# Patient Record
Sex: Female | Born: 1955
Health system: Southern US, Community
[De-identification: ages and names within clinical notes are randomized; demographics above are authoritative.]

## PROBLEM LIST (undated history)

## (undated) DIAGNOSIS — F32A Depression, unspecified: Secondary | ICD-10-CM

## (undated) DIAGNOSIS — F419 Anxiety disorder, unspecified: Secondary | ICD-10-CM

## (undated) DIAGNOSIS — E039 Hypothyroidism, unspecified: Secondary | ICD-10-CM

## (undated) DIAGNOSIS — J45909 Unspecified asthma, uncomplicated: Secondary | ICD-10-CM

## (undated) DIAGNOSIS — I10 Essential (primary) hypertension: Secondary | ICD-10-CM

## (undated) DIAGNOSIS — F329 Major depressive disorder, single episode, unspecified: Secondary | ICD-10-CM

## (undated) HISTORY — PX: TUBAL LIGATION: SHX77

## (undated) HISTORY — PX: APPENDECTOMY: SHX54

---

## 2000-02-10 ENCOUNTER — Ambulatory Visit (HOSPITAL_COMMUNITY): Admission: RE | Admit: 2000-02-10 | Discharge: 2000-02-10 | Payer: Self-pay | Admitting: *Deleted

## 2004-06-17 ENCOUNTER — Ambulatory Visit (HOSPITAL_COMMUNITY): Admission: RE | Admit: 2004-06-17 | Discharge: 2004-06-17 | Payer: Self-pay | Admitting: Internal Medicine

## 2010-01-18 ENCOUNTER — Ambulatory Visit (HOSPITAL_COMMUNITY): Admission: RE | Admit: 2010-01-18 | Discharge: 2010-01-18 | Payer: Self-pay | Admitting: Internal Medicine

## 2010-04-05 ENCOUNTER — Ambulatory Visit: Payer: Self-pay | Admitting: Gastroenterology

## 2010-04-05 DIAGNOSIS — R1031 Right lower quadrant pain: Secondary | ICD-10-CM | POA: Insufficient documentation

## 2010-04-05 DIAGNOSIS — K219 Gastro-esophageal reflux disease without esophagitis: Secondary | ICD-10-CM | POA: Insufficient documentation

## 2010-04-05 DIAGNOSIS — R198 Other specified symptoms and signs involving the digestive system and abdomen: Secondary | ICD-10-CM | POA: Insufficient documentation

## 2010-04-05 DIAGNOSIS — R1011 Right upper quadrant pain: Secondary | ICD-10-CM | POA: Insufficient documentation

## 2010-04-05 DIAGNOSIS — R634 Abnormal weight loss: Secondary | ICD-10-CM | POA: Insufficient documentation

## 2010-04-05 DIAGNOSIS — R112 Nausea with vomiting, unspecified: Secondary | ICD-10-CM | POA: Insufficient documentation

## 2010-04-06 ENCOUNTER — Ambulatory Visit (HOSPITAL_COMMUNITY): Admission: RE | Admit: 2010-04-06 | Discharge: 2010-04-06 | Payer: Self-pay | Admitting: Gastroenterology

## 2010-04-06 ENCOUNTER — Ambulatory Visit: Payer: Self-pay | Admitting: Gastroenterology

## 2010-06-04 ENCOUNTER — Encounter (INDEPENDENT_AMBULATORY_CARE_PROVIDER_SITE_OTHER): Payer: Self-pay | Admitting: *Deleted

## 2010-07-11 ENCOUNTER — Encounter: Payer: Self-pay | Admitting: Internal Medicine

## 2010-07-20 NOTE — Assessment & Plan Note (Signed)
Summary: ABD PAIN,GERD/ACID REFLUX/LAW   Visit Type:  Consult Referring Provider:  Sherwood Gambler Primary Care Provider:  Sherwood Gambler  Chief Complaint:  abd pain x several months.  History of Present Illness: [name] is a pleasant 55 y/o WF, patient of Dr. Sherwood Gambler, who presents with several month h/o right sided abd pain.  Abd pain for couple months duration. Pain is constant and in right abd. Feels like twisting sensation and swelling. Worse with most foods. Unable to eat much. Eating applesauce, crackers, soup. Cannot eat meat. Cannot sit, lay, walk when pain severe. Nausea, some vomiting. BMs used to be once per day. Now 2-3 loose stools per day. No melena, brbpr. Wakes up at night with abd pain. No nocturnal BMs. Taking ibuprofen 800mg , about 4 per day for abd pain. On ibuprofen chronically for headaches. No heartburn on Nexium. No GU issues. Lost 15 pounds.  Abd U/S 01/18/10-->gb normal, fatty liver. Labs 7/11-->CMET, CBC both normal. H.Pylori IgG negative. Amylase/lipase normal. TSH 5.527. Seen in ED at [facility] 03/17/10. CMET, CBC, lipase all normal. U/A negative. CT A/P without contrast-->s/p appendectomy, previous hypodense mass in lower uterine segment not identified but there is some residual scarring/fullness at this level which cannot be assesssed without IV contrast.  She reports gyn exam and vaginal u/s done and unremarkable.  Current Medications (verified): 1)  Synthroid 75 Mcg Tabs (Levothyroxine Sodium) .... Once Daily 2)  Triazolam 0.25 Mg Tabs (Triazolam) .... Once Daily 3)  Furosemide 20 Mg Tabs (Furosemide) .... Once Daily 4)  Clonazepam 1 Mg Tabs (Clonazepam) .... Once Daily 5)  Nexium 40 Mg Cpdr (Esomeprazole Magnesium) .... Once Daily 6)  Ambien 10 Mg Tabs (Zolpidem Tartrate) .... At Bedtime As Needed  Allergies (verified): 1)  ! Codeine 2)  ! Zoloft 3)  ! Celexa 4)  ! Paxil 5)  ! Lunesta (Eszopiclone)  Past History:  Past Medical History: Anxiety  Disorder GERD Hypothyroidism Migraines No EGD/TCS.  Past Surgical History: Appendectomy, 15 years ago Tubal Ligation Ovarian cystectomy  Family History: No FH of CRC, IBD, liver disease.  Social History: Married. One child. Nonsmoker. No drugs. Rare beer. Disability for anxiety.  Review of Systems General:  Complains of weight loss; denies fever, chills, sweats, anorexia, fatigue, and weakness. Eyes:  Denies vision loss. ENT:  Denies nasal congestion, sore throat, hoarseness, and difficulty swallowing. CV:  Denies chest pains, angina, palpitations, dyspnea on exertion, and peripheral edema. Resp:  Denies dyspnea at rest, dyspnea with exercise, cough, sputum, and wheezing. GI:  See HPI. GU:  Denies urinary burning, blood in urine, and abnormal vaginal bleeding. MS:  Denies joint pain / LOM. Derm:  Denies rash and itching. Neuro:  Denies weakness, frequent headaches, memory loss, and confusion. Psych:  Complains of anxiety; denies depression, memory loss, suicidal ideation, and hallucinations. Endo:  Complains of unusual weight change. Heme:  Denies bruising and bleeding. Allergy:  Denies hives and rash.  Vital Signs:  Patient profile:   55 year old female Height:      63 inches Weight:      158 pounds BMI:     28.09 Temp:     98.2 degrees F oral Pulse rate:   68 / minute BP sitting:   118 / 84  (left arm)  Vitals Entered By: Hendricks Limes LPN (April 05, 2010 8:25 AM)  Physical Exam  General:  Well developed, well nourished, no acute distress. Head:  Normocephalic and atraumatic. Eyes:  Conjunctivae pink, no scleral icterus.  Mouth:  Oropharyngeal  mucosa moist, pink.  No lesions, erythema or exudate.    Neck:  Supple; no masses or thyromegaly. Lungs:  Clear throughout to auscultation. Heart:  Regular rate and rhythm; no murmurs, rubs,  or bruits. Abdomen:  Soft. Positive BS. Diffuse tenderness in right abd but more in RLQ. No rebound or guarding. No HSM or masses.  No abd bruit or hernia.  Rectal:  deferred until time of colonoscopy.   Extremities:  No clubbing, cyanosis, edema or deformities noted. Neurologic:  Alert and  oriented x4;  grossly normal neurologically. Skin:  Intact without significant lesions or rashes. Cervical Nodes:  No significant cervical adenopathy. Psych:  Alert and cooperative. Normal mood and affect.  Impression & Recommendations:  Problem # 1:  ABDOMINAL PAIN, RIGHT LOWER QUADRANT (ICD-789.03)  Abd pain of two months duration, most in RLQ. Associated with N/V, diarrhea, weight loss. Noncontrast CT A/P does not explain symptoms. Abd u/s unremarkable as well. Labs and U/A unremarkable. s/p prior appendectomy. DDx includes biliary etiolgoy, functional abd pain/IBS, IBD, PUD, unlikely malignancy. Recommend EGD/TCS as initial step in w/u. If negative, consider HIDA. Colonoscopy/EGD to be performed in near future.  Risks, alternatives, and benefits including but not limited to the risk of reaction to medication, bleeding, infection, and perforation were addressed.  Patient voiced understanding and provided verbal consent.   Orders: Consultation Level IV (16109)  Problem # 2:  WEIGHT LOSS (ICD-783.21)  Secondary to limited by mouth intake related to abd pain.  Orders: Consultation Level IV (60454)  Problem # 3:  GERD (ICD-530.81)  Chronic GERD, well-controlled on Nexium.  Orders: Consultation Level IV (09811)  Problem # 4:  CHANGE IN BOWELS (ICD-787.99)  Change in bowels, from one normal stool daily to diarrhea. Colonoscopy as planned.  CONSIDER SB BX AT TIME OF ENDO.  Orders: Consultation Level IV (91478) Prescriptions: PROMETHAZINE HCL 25 MG TABS (PROMETHAZINE HCL) one by mouth every 4-6 hours as needed n/v  #20 x 0   Entered and Authorized by:   Leanna Battles. Dixon Boos   Signed by:   Leanna Battles Trisa Cranor PA-C on 04/05/2010   Method used:   Print then Give to Patient   RxID:   [id number] HYDROCODONE-ACETAMINOPHEN  5-500 MG TABS (HYDROCODONE-ACETAMINOPHEN) one by mouth every 4-6 hours as needed pain  #20 x 0   Entered and Authorized by:   Leanna Battles. Dixon Boos   Signed by:   Leanna Battles Riggin Cuttino PA-C on 04/05/2010   Method used:   Print then Give to Patient   RxID:   [id number] PROMETHAZINE HCL 25 MG/ML SOLN (PROMETHAZINE HCL) one by mouth every 4-6 hours as needed pain  #20 x 0   Entered and Authorized by:   Leanna Battles. Dixon Boos   Signed by:   Leanna Battles Jannel Lynne PA-C on 04/05/2010   Method used:   Print then Give to Patient   RxID:   [id number]  DISREGARD THE PROMETHAZINE SOLUTION RX. IT WAS VOIDED.  I would like to thank Dr. Sherwood Gambler for allowing [place] to take part in the care of this nice patient.

## 2010-07-20 NOTE — Letter (Signed)
Summary: TCS/EGD ORDER  TCS/EGD ORDER   Imported By: Ave Filter 04/05/2010 09:24:05  _____________________________________________________________________  External Attachment:    Type:   Image     Comment:   External Document

## 2010-07-22 NOTE — Letter (Signed)
Summary: Recall Office Visit  [facility]  [address]   [place], [place] [address]   Phone: [phone]  Fax: [phone]      June 04, 2010   [facility] [address] [place], [place]  [address] [birth year]   Dear Rebekah Gonzalez,   According to our records, it is time for you to schedule a follow-up office visit with [place].   At your convenience, please call [phone] to schedule an office visit. If you have any questions, concerns, or feel that this letter is in error, we would appreciate your call.   Sincerely,    Rexene Alberts  [facility] Ph: [phone]   Fax: [phone]

## 2011-11-29 DIAGNOSIS — R51 Headache: Secondary | ICD-10-CM | POA: Diagnosis not present

## 2011-11-29 DIAGNOSIS — I499 Cardiac arrhythmia, unspecified: Secondary | ICD-10-CM | POA: Diagnosis not present

## 2011-11-29 DIAGNOSIS — Z802 Family history of malignant neoplasm of other respiratory and intrathoracic organs: Secondary | ICD-10-CM | POA: Diagnosis not present

## 2011-11-29 DIAGNOSIS — G459 Transient cerebral ischemic attack, unspecified: Secondary | ICD-10-CM | POA: Diagnosis not present

## 2011-11-29 DIAGNOSIS — R4789 Other speech disturbances: Secondary | ICD-10-CM | POA: Diagnosis not present

## 2011-11-29 DIAGNOSIS — R112 Nausea with vomiting, unspecified: Secondary | ICD-10-CM | POA: Diagnosis not present

## 2011-11-29 DIAGNOSIS — R471 Dysarthria and anarthria: Secondary | ICD-10-CM | POA: Diagnosis not present

## 2011-11-29 DIAGNOSIS — R42 Dizziness and giddiness: Secondary | ICD-10-CM | POA: Diagnosis not present

## 2011-11-29 DIAGNOSIS — Z8249 Family history of ischemic heart disease and other diseases of the circulatory system: Secondary | ICD-10-CM | POA: Diagnosis not present

## 2011-11-29 DIAGNOSIS — Z2821 Immunization not carried out because of patient refusal: Secondary | ICD-10-CM | POA: Diagnosis not present

## 2011-11-29 DIAGNOSIS — G319 Degenerative disease of nervous system, unspecified: Secondary | ICD-10-CM | POA: Diagnosis not present

## 2011-11-29 DIAGNOSIS — R404 Transient alteration of awareness: Secondary | ICD-10-CM | POA: Diagnosis not present

## 2011-11-29 DIAGNOSIS — Z823 Family history of stroke: Secondary | ICD-10-CM | POA: Diagnosis not present

## 2011-11-29 DIAGNOSIS — R29898 Other symptoms and signs involving the musculoskeletal system: Secondary | ICD-10-CM | POA: Diagnosis not present

## 2011-11-29 DIAGNOSIS — R5381 Other malaise: Secondary | ICD-10-CM | POA: Diagnosis not present

## 2011-11-29 DIAGNOSIS — K219 Gastro-esophageal reflux disease without esophagitis: Secondary | ICD-10-CM | POA: Diagnosis not present

## 2011-11-29 DIAGNOSIS — F411 Generalized anxiety disorder: Secondary | ICD-10-CM | POA: Diagnosis not present

## 2011-11-29 DIAGNOSIS — R0602 Shortness of breath: Secondary | ICD-10-CM | POA: Diagnosis not present

## 2011-11-30 DIAGNOSIS — G459 Transient cerebral ischemic attack, unspecified: Secondary | ICD-10-CM | POA: Diagnosis not present

## 2011-11-30 DIAGNOSIS — R4789 Other speech disturbances: Secondary | ICD-10-CM | POA: Diagnosis not present

## 2011-11-30 DIAGNOSIS — R42 Dizziness and giddiness: Secondary | ICD-10-CM | POA: Diagnosis not present

## 2011-11-30 DIAGNOSIS — I6350 Cerebral infarction due to unspecified occlusion or stenosis of unspecified cerebral artery: Secondary | ICD-10-CM | POA: Diagnosis not present

## 2011-12-01 DIAGNOSIS — G459 Transient cerebral ischemic attack, unspecified: Secondary | ICD-10-CM | POA: Diagnosis not present

## 2011-12-20 DIAGNOSIS — F329 Major depressive disorder, single episode, unspecified: Secondary | ICD-10-CM | POA: Diagnosis not present

## 2011-12-20 DIAGNOSIS — G4700 Insomnia, unspecified: Secondary | ICD-10-CM | POA: Diagnosis not present

## 2011-12-20 DIAGNOSIS — F3289 Other specified depressive episodes: Secondary | ICD-10-CM | POA: Diagnosis not present

## 2011-12-20 DIAGNOSIS — Z6827 Body mass index (BMI) 27.0-27.9, adult: Secondary | ICD-10-CM | POA: Diagnosis not present

## 2011-12-20 DIAGNOSIS — R51 Headache: Secondary | ICD-10-CM | POA: Diagnosis not present

## 2011-12-20 DIAGNOSIS — F411 Generalized anxiety disorder: Secondary | ICD-10-CM | POA: Diagnosis not present

## 2011-12-26 DIAGNOSIS — Z79899 Other long term (current) drug therapy: Secondary | ICD-10-CM | POA: Diagnosis not present

## 2011-12-26 DIAGNOSIS — E785 Hyperlipidemia, unspecified: Secondary | ICD-10-CM | POA: Diagnosis not present

## 2012-03-26 DIAGNOSIS — Z1151 Encounter for screening for human papillomavirus (HPV): Secondary | ICD-10-CM | POA: Diagnosis not present

## 2012-03-26 DIAGNOSIS — Z01419 Encounter for gynecological examination (general) (routine) without abnormal findings: Secondary | ICD-10-CM | POA: Diagnosis not present

## 2012-03-28 DIAGNOSIS — Z1231 Encounter for screening mammogram for malignant neoplasm of breast: Secondary | ICD-10-CM | POA: Diagnosis not present

## 2012-05-11 DIAGNOSIS — Z6828 Body mass index (BMI) 28.0-28.9, adult: Secondary | ICD-10-CM | POA: Diagnosis not present

## 2012-05-11 DIAGNOSIS — I10 Essential (primary) hypertension: Secondary | ICD-10-CM | POA: Diagnosis not present

## 2012-05-11 DIAGNOSIS — Z79899 Other long term (current) drug therapy: Secondary | ICD-10-CM | POA: Diagnosis not present

## 2012-05-11 DIAGNOSIS — F3289 Other specified depressive episodes: Secondary | ICD-10-CM | POA: Diagnosis not present

## 2012-05-11 DIAGNOSIS — F411 Generalized anxiety disorder: Secondary | ICD-10-CM | POA: Diagnosis not present

## 2012-05-11 DIAGNOSIS — F329 Major depressive disorder, single episode, unspecified: Secondary | ICD-10-CM | POA: Diagnosis not present

## 2012-05-11 DIAGNOSIS — Z23 Encounter for immunization: Secondary | ICD-10-CM | POA: Diagnosis not present

## 2012-06-26 DIAGNOSIS — E038 Other specified hypothyroidism: Secondary | ICD-10-CM | POA: Diagnosis not present

## 2013-01-16 DIAGNOSIS — I10 Essential (primary) hypertension: Secondary | ICD-10-CM | POA: Diagnosis not present

## 2013-01-16 DIAGNOSIS — IMO0002 Reserved for concepts with insufficient information to code with codable children: Secondary | ICD-10-CM | POA: Diagnosis not present

## 2013-01-16 DIAGNOSIS — F411 Generalized anxiety disorder: Secondary | ICD-10-CM | POA: Diagnosis not present

## 2013-04-22 DIAGNOSIS — Z1231 Encounter for screening mammogram for malignant neoplasm of breast: Secondary | ICD-10-CM | POA: Diagnosis not present

## 2013-05-08 DIAGNOSIS — IMO0002 Reserved for concepts with insufficient information to code with codable children: Secondary | ICD-10-CM | POA: Diagnosis not present

## 2013-05-08 DIAGNOSIS — Z23 Encounter for immunization: Secondary | ICD-10-CM | POA: Diagnosis not present

## 2013-05-08 DIAGNOSIS — J018 Other acute sinusitis: Secondary | ICD-10-CM | POA: Diagnosis not present

## 2013-05-08 DIAGNOSIS — I10 Essential (primary) hypertension: Secondary | ICD-10-CM | POA: Diagnosis not present

## 2013-08-16 DIAGNOSIS — R059 Cough, unspecified: Secondary | ICD-10-CM | POA: Diagnosis not present

## 2013-08-16 DIAGNOSIS — I10 Essential (primary) hypertension: Secondary | ICD-10-CM | POA: Diagnosis not present

## 2013-08-16 DIAGNOSIS — Z6825 Body mass index (BMI) 25.0-25.9, adult: Secondary | ICD-10-CM | POA: Diagnosis not present

## 2013-08-16 DIAGNOSIS — R05 Cough: Secondary | ICD-10-CM | POA: Diagnosis not present

## 2013-08-16 DIAGNOSIS — F411 Generalized anxiety disorder: Secondary | ICD-10-CM | POA: Diagnosis not present

## 2014-01-10 DIAGNOSIS — Z6826 Body mass index (BMI) 26.0-26.9, adult: Secondary | ICD-10-CM | POA: Diagnosis not present

## 2014-01-10 DIAGNOSIS — M79609 Pain in unspecified limb: Secondary | ICD-10-CM | POA: Diagnosis not present

## 2014-01-10 DIAGNOSIS — F411 Generalized anxiety disorder: Secondary | ICD-10-CM | POA: Diagnosis not present

## 2014-04-25 DIAGNOSIS — F329 Major depressive disorder, single episode, unspecified: Secondary | ICD-10-CM | POA: Diagnosis not present

## 2014-04-25 DIAGNOSIS — E063 Autoimmune thyroiditis: Secondary | ICD-10-CM | POA: Diagnosis not present

## 2014-04-25 DIAGNOSIS — Z6827 Body mass index (BMI) 27.0-27.9, adult: Secondary | ICD-10-CM | POA: Diagnosis not present

## 2014-04-25 DIAGNOSIS — F419 Anxiety disorder, unspecified: Secondary | ICD-10-CM | POA: Diagnosis not present

## 2014-04-25 DIAGNOSIS — E663 Overweight: Secondary | ICD-10-CM | POA: Diagnosis not present

## 2014-04-28 DIAGNOSIS — Z1231 Encounter for screening mammogram for malignant neoplasm of breast: Secondary | ICD-10-CM | POA: Diagnosis not present

## 2014-09-10 DIAGNOSIS — J309 Allergic rhinitis, unspecified: Secondary | ICD-10-CM | POA: Diagnosis not present

## 2014-09-10 DIAGNOSIS — Z6828 Body mass index (BMI) 28.0-28.9, adult: Secondary | ICD-10-CM | POA: Diagnosis not present

## 2014-09-10 DIAGNOSIS — F419 Anxiety disorder, unspecified: Secondary | ICD-10-CM | POA: Diagnosis not present

## 2014-09-10 DIAGNOSIS — E663 Overweight: Secondary | ICD-10-CM | POA: Diagnosis not present

## 2014-09-10 DIAGNOSIS — I10 Essential (primary) hypertension: Secondary | ICD-10-CM | POA: Diagnosis not present

## 2014-09-22 DIAGNOSIS — Z79899 Other long term (current) drug therapy: Secondary | ICD-10-CM | POA: Diagnosis not present

## 2014-09-22 DIAGNOSIS — Z0000 Encounter for general adult medical examination without abnormal findings: Secondary | ICD-10-CM | POA: Diagnosis not present

## 2014-09-22 DIAGNOSIS — D509 Iron deficiency anemia, unspecified: Secondary | ICD-10-CM | POA: Diagnosis not present

## 2014-09-22 DIAGNOSIS — E039 Hypothyroidism, unspecified: Secondary | ICD-10-CM | POA: Diagnosis not present

## 2014-09-29 ENCOUNTER — Other Ambulatory Visit (HOSPITAL_COMMUNITY): Payer: Self-pay | Admitting: Internal Medicine

## 2014-09-29 DIAGNOSIS — R945 Abnormal results of liver function studies: Secondary | ICD-10-CM

## 2014-09-30 DIAGNOSIS — R945 Abnormal results of liver function studies: Secondary | ICD-10-CM | POA: Diagnosis not present

## 2014-10-02 ENCOUNTER — Ambulatory Visit (HOSPITAL_COMMUNITY)
Admission: RE | Admit: 2014-10-02 | Discharge: 2014-10-02 | Disposition: A | Discharge status: Home / Self Care | Payer: Medicare Other | Source: Ambulatory Visit | Attending: Internal Medicine | Admitting: Internal Medicine

## 2014-10-02 DIAGNOSIS — N281 Cyst of kidney, acquired: Secondary | ICD-10-CM | POA: Insufficient documentation

## 2014-10-02 DIAGNOSIS — K760 Fatty (change of) liver, not elsewhere classified: Secondary | ICD-10-CM | POA: Insufficient documentation

## 2014-10-02 DIAGNOSIS — K8020 Calculus of gallbladder without cholecystitis without obstruction: Secondary | ICD-10-CM | POA: Diagnosis not present

## 2014-10-02 DIAGNOSIS — R945 Abnormal results of liver function studies: Secondary | ICD-10-CM

## 2014-10-02 DIAGNOSIS — R7989 Other specified abnormal findings of blood chemistry: Secondary | ICD-10-CM | POA: Insufficient documentation

## 2014-10-28 DIAGNOSIS — K8020 Calculus of gallbladder without cholecystitis without obstruction: Secondary | ICD-10-CM | POA: Diagnosis not present

## 2014-10-29 NOTE — H&P (Signed)
  NTS SOAP Note  Vital Signs:  Vitals as of: 4/82/7078: Systolic 675: Diastolic 92: Heart Rate 73: Temp 98.34F: Height 54ft 3in: Weight 155Lbs 0 Ounces: Pain Level 4: BMI 27.46  BMI : 27.46 kg/m2  Subjective: This 59 year old female presents for of right upper quadrant abdominal pain.  Has been occurring over the past several years.  Made worse with fatty food.  Pain radiates around the right flank to the right shoulder.  No fever, chills, jaundice.  Occurring more frequently.  Review of Symptoms:  Constitutional:unremarkable   Head:unremarkable Eyes:unremarkable   Nose/Mouth/Throat:unremarkable Cardiovascular:  unremarkable Respiratory:unremarkable Gastrointestinal:   abdominal pain, nausea, vomiting, heartburn Genitourinary:unremarkable   Musculoskeletal:unremarkable Skin:unremarkable Hematolgic/Lymphatic:unremarkable   hay fever   Past Medical History:  Reviewed  Past Medical History  Surgical History: BTL, appendectomy Medical Problems: hypothryoidism, HTN, anxiety Allergies: codeine Medications: levothyroxine, lasix, buprione, alprazolam, kdur   Social History:Reviewed  Social History  Preferred Language: English Race:  White Ethnicity: Not Hispanic / Latino Age: 73 year Marital Status:  S Alcohol: 2 glasses of wine with dinner daily   Smoking Status: Never smoker reviewed on 10/28/2014 Functional Status reviewed on 10/28/2014 ------------------------------------------------ Bathing: Normal Cooking: Normal Dressing: Normal Driving: Normal Eating: Normal Managing Meds: Normal Oral Care: Normal Shopping: Normal Toileting: Normal Transferring: Normal Walking: Normal Cognitive Status reviewed on 10/28/2014 ------------------------------------------------ Attention: Normal Decision Making: Normal Language: Normal Memory: Normal Motor: Normal Perception: Normal Problem Solving: Normal Visual and Spatial: Normal   Family  History:Reviewed  Family Health History Mother, Deceased; Cancer unspecified;  Father, Deceased; Heart attack (myocardial infarction); Chronic obstructive lung disease (COPD);     Objective Information: General:Well appearing, well nourished in no distress. Heart:RRR, no murmur or gallop.  Normal S1, S2.  No S3, S4.  Lungs:  CTA bilaterally, no wheezes, rhonchi, rales.  Breathing unlabored. Abdomen:Soft, NT/ND, no HSM, no masses.  Minimal discomfort in the right upper quadrant to palpation.  Assessment:Biliary colic, cholelithiasis  Diagnoses: 574.20  K80.20 Gallstone (Calculus of gallbladder without cholecystitis without obstruction)  Procedures: 44920 - OFFICE OUTPATIENT NEW 30 MINUTES    Plan:  Scheduled for laparoscopic cholecystectomy on 11/26/14.   Patient Education:Alternative treatments to surgery were discussed with patient (and family).  Risks and benefits  of procedure including bleeding, infection, hepatobiliary injury, and the possibility of an open procuedre were fully explained to the patient (and family) who gave informed consent. Patient/family questions were addressed.  Follow-up:Pending Surgery

## 2014-11-20 NOTE — Patient Instructions (Signed)
Rebekah Gonzalez  11/20/2014    Your procedure is scheduled on 11/26/14.  Report to [facility] at 08:50 A.M.  Call this number if you have problems the morning of surgery:  [phone]   Remember:  Do not eat food or drink liquids after midnight.  Take these medicines the morning of surgery with A SIP OF WATER: Alprazolam, Bupropion and Levothyroxine.   Do not wear jewelry, make-up or nail polish.  Do not wear lotions, powders, or perfumes.  You may wear deodorant.  Do not shave 48 hours prior to surgery.  Men may shave face and neck.  Do not bring valuables to the hospital.  [facility] is not responsible for any belongings or valuables.  Contacts, dentures or bridgework may not be worn into surgery.  Leave your suitcase in the car.  After surgery it may be brought to your room.  For patients admitted to the hospital, discharge time will be determined by your treatment team.  Patients discharged the day of surgery will not be allowed to drive home.   Special instructions:  Shower using Hibiclens (CHG bath) the night before surgery and the morning of surgery.  Please read over the following fact sheets that you were given. Anesthesia Post-op Instructions and Care and Recovery After Surgery    Laparoscopic Cholecystectomy Laparoscopic cholecystectomy is surgery to remove the gallbladder. The gallbladder is located in the upper right part of the abdomen, behind the liver. It is a storage sac for bile produced in the liver. Bile aids in the digestion and absorption of fats. Cholecystectomy is often done for inflammation of the gallbladder (cholecystitis). This condition is usually caused by a buildup of gallstones (cholelithiasis) in your gallbladder. Gallstones can block the flow of bile, resulting in inflammation and pain. In severe cases, emergency surgery may be required. When emergency surgery is not required, you will have time to prepare for the procedure. Laparoscopic surgery is  an alternative to open surgery. Laparoscopic surgery has a shorter recovery time. Your common bile duct may also need to be examined during the procedure. If stones are found in the common bile duct, they may be removed. LET [facility] CARE PROVIDER KNOW ABOUT:  Any allergies you have.  All medicines you are taking, including vitamins, herbs, eye drops, creams, and over-the-counter medicines.  Previous problems you or members of your family have had with the use of anesthetics.  Any blood disorders you have.  Previous surgeries you have had.  Medical conditions you have. RISKS AND COMPLICATIONS Generally, this is a safe procedure. However, as with any procedure, complications can occur. Possible complications include:  Infection.  Damage to the common bile duct, nerves, arteries, veins, or other internal organs such as the stomach, liver, or intestines.  Bleeding.  A stone may remain in the common bile duct.  A bile leak from the cyst duct that is clipped when your gallbladder is removed.  The need to convert to open surgery, which requires a larger incision in the abdomen. This may be necessary if your surgeon thinks it is not safe to continue with a laparoscopic procedure. BEFORE THE PROCEDURE  Ask your health care provider about changing or stopping any regular medicines. You will need to stop taking aspirin or blood thinners at least 5 days prior to surgery.  Do not eat or drink anything after midnight the night before surgery.  Let your health care provider know if you develop a cold or other infectious problem  before surgery. PROCEDURE   You will be given medicine to make you sleep through the procedure (general anesthetic). A breathing tube will be placed in your mouth.  When you are asleep, your surgeon will make several small cuts (incisions) in your abdomen.  A thin, lighted tube with a tiny camera on the end (laparoscope) is inserted through one of the small  incisions. The camera on the laparoscope sends a picture to a TV screen in the operating room. This gives the surgeon a good view inside your abdomen.  A gas will be pumped into your abdomen. This expands your abdomen so that the surgeon has more room to perform the surgery.  Other tools needed for the procedure are inserted through the other incisions. The gallbladder is removed through one of the incisions.  After the removal of your gallbladder, the incisions will be closed with stitches, staples, or skin glue. AFTER THE PROCEDURE  You will be taken to a recovery area where your progress will be checked often.  You may be allowed to go home the same day if your pain is controlled and you can tolerate liquids. Document Released: 06/06/2005 Document Revised: 03/27/2013 Document Reviewed: 01/16/2013 [facility] Patient Information 2015 [place], [place]. This information is not intended to replace advice given to you by your health care provider. Make sure you discuss any questions you have with your health care provider.    PATIENT INSTRUCTIONS POST-ANESTHESIA  IMMEDIATELY FOLLOWING SURGERY:  Do not drive or operate machinery for the first twenty four hours after surgery.  Do not make any important decisions for twenty four hours after surgery or while taking narcotic pain medications or sedatives.  If you develop intractable nausea and vomiting or a severe headache please notify your doctor immediately.  FOLLOW-UP:  Please make an appointment with your surgeon as instructed. You do not need to follow up with anesthesia unless specifically instructed to do so.  WOUND CARE INSTRUCTIONS (if applicable):  Keep a dry clean dressing on the anesthesia/puncture wound site if there is drainage.  Once the wound has quit draining you may leave it open to air.  Generally you should leave the bandage intact for twenty four hours unless there is drainage.  If the epidural site drains for more than 36-48 hours  please call the anesthesia department.  QUESTIONS?:  Please feel free to call your physician or the hospital operator if you have any questions, and they will be happy to assist you.

## 2014-11-21 ENCOUNTER — Encounter (HOSPITAL_COMMUNITY)
Admission: RE | Admit: 2014-11-21 | Discharge: 2014-11-21 | Disposition: A | Discharge status: Home / Self Care | Payer: Medicare Other | Source: Ambulatory Visit | Attending: General Surgery | Admitting: General Surgery

## 2014-11-21 ENCOUNTER — Encounter (HOSPITAL_COMMUNITY): Payer: Self-pay

## 2014-11-21 DIAGNOSIS — K8020 Calculus of gallbladder without cholecystitis without obstruction: Secondary | ICD-10-CM | POA: Diagnosis not present

## 2014-11-21 DIAGNOSIS — Z01818 Encounter for other preprocedural examination: Secondary | ICD-10-CM | POA: Diagnosis not present

## 2014-11-21 HISTORY — DX: Major depressive disorder, single episode, unspecified: F32.9

## 2014-11-21 HISTORY — DX: Depression, unspecified: F32.A

## 2014-11-21 HISTORY — DX: Anxiety disorder, unspecified: F41.9

## 2014-11-21 HISTORY — DX: Essential (primary) hypertension: I10

## 2014-11-21 HISTORY — DX: Hypothyroidism, unspecified: E03.9

## 2014-11-21 LAB — BASIC METABOLIC PANEL
Anion gap: 10 (ref 5–15)
BUN: 19 mg/dL (ref 6–20)
CO2: 29 mmol/L (ref 22–32)
Calcium: 10.2 mg/dL (ref 8.9–10.3)
Chloride: 99 mmol/L — ABNORMAL LOW (ref 101–111)
Creatinine, Ser: 0.84 mg/dL (ref 0.44–1.00)
GFR calc Af Amer: >60 mL/min (ref >60)
Glucose, Bld: 93 mg/dL (ref 65–99)
Potassium: 4.1 mmol/L (ref 3.5–5.1)
Sodium: 138 mmol/L (ref 135–145)

## 2014-11-21 LAB — CBC WITH DIFFERENTIAL/PLATELET
Basophils Absolute: 0 10*3/uL (ref 0.0–0.1)
Basophils Relative: 1 % (ref 0–1)
Eosinophils Absolute: 0.1 10*3/uL (ref 0.0–0.7)
Eosinophils Relative: 1 % (ref 0–5)
HEMATOCRIT: 41.6 % (ref 36.0–46.0)
Hemoglobin: 13.8 g/dL (ref 12.0–15.0)
LYMPHS ABS: 1.7 10*3/uL (ref 0.7–4.0)
Lymphocytes Relative: 26 % (ref 12–46)
MCH: 31.3 pg (ref 26.0–34.0)
MCHC: 33.2 g/dL (ref 30.0–36.0)
MCV: 94.3 fL (ref 78.0–100.0)
Monocytes Absolute: 0.5 10*3/uL (ref 0.1–1.0)
Monocytes Relative: 8 % (ref 3–12)
Neutro Abs: 4.2 10*3/uL (ref 1.7–7.7)
Neutrophils Relative %: 64 % (ref 43–77)
Platelets: 186 10*3/uL (ref 150–400)
RBC: 4.41 MIL/uL (ref 3.87–5.11)
RDW: 12.5 % (ref 11.5–15.5)
WBC: 6.5 10*3/uL (ref 4.0–10.5)

## 2014-11-21 LAB — HEPATIC FUNCTION PANEL
ALT: 86 U/L — ABNORMAL HIGH (ref 14–54)
AST: 73 U/L — ABNORMAL HIGH (ref 15–41)
Albumin: 4.4 g/dL (ref 3.5–5.0)
Alkaline Phosphatase: 60 U/L (ref 38–126)
Bilirubin, Direct: 0.1 mg/dL (ref 0.1–0.5)
Indirect Bilirubin: 0.6 mg/dL (ref 0.3–0.9)
TOTAL PROTEIN: 7.6 g/dL (ref 6.5–8.1)
Total Bilirubin: 0.7 mg/dL (ref 0.3–1.2)

## 2014-11-26 ENCOUNTER — Ambulatory Visit (HOSPITAL_COMMUNITY): Payer: Medicare Other | Admitting: Anesthesiology

## 2014-11-26 ENCOUNTER — Encounter (HOSPITAL_COMMUNITY)
Admission: RE | Disposition: A | Discharge status: Home / Self Care | Payer: Self-pay | Source: Ambulatory Visit | Attending: General Surgery

## 2014-11-26 ENCOUNTER — Encounter (HOSPITAL_COMMUNITY): Payer: Self-pay

## 2014-11-26 ENCOUNTER — Ambulatory Visit (HOSPITAL_COMMUNITY)
Admission: RE | Admit: 2014-11-26 | Discharge: 2014-11-26 | Disposition: A | Discharge status: Home / Self Care | Payer: Medicare Other | Source: Ambulatory Visit | Attending: General Surgery | Admitting: General Surgery

## 2014-11-26 DIAGNOSIS — Z79899 Other long term (current) drug therapy: Secondary | ICD-10-CM | POA: Diagnosis not present

## 2014-11-26 DIAGNOSIS — K8010 Calculus of gallbladder with chronic cholecystitis without obstruction: Secondary | ICD-10-CM | POA: Insufficient documentation

## 2014-11-26 DIAGNOSIS — F329 Major depressive disorder, single episode, unspecified: Secondary | ICD-10-CM | POA: Insufficient documentation

## 2014-11-26 DIAGNOSIS — F419 Anxiety disorder, unspecified: Secondary | ICD-10-CM | POA: Insufficient documentation

## 2014-11-26 DIAGNOSIS — E039 Hypothyroidism, unspecified: Secondary | ICD-10-CM | POA: Insufficient documentation

## 2014-11-26 DIAGNOSIS — I10 Essential (primary) hypertension: Secondary | ICD-10-CM | POA: Insufficient documentation

## 2014-11-26 DIAGNOSIS — K8020 Calculus of gallbladder without cholecystitis without obstruction: Secondary | ICD-10-CM | POA: Diagnosis not present

## 2014-11-26 HISTORY — PX: CHOLECYSTECTOMY: SHX55

## 2014-11-26 SURGERY — LAPAROSCOPIC CHOLECYSTECTOMY
Anesthesia: General

## 2014-11-26 MED ORDER — HYDROCODONE-ACETAMINOPHEN 5-325 MG PO TABS: 1.0000 | ORAL_TABLET | ORAL | Status: AC | PRN

## 2014-11-26 MED ORDER — PROPOFOL 10 MG/ML IV BOLUS
INTRAVENOUS | Status: AC
  Filled 2014-11-26: qty 20

## 2014-11-26 MED ORDER — CIPROFLOXACIN IN D5W 400 MG/200ML IV SOLN
400.0000 mg | INTRAVENOUS | Status: AC
  Administered 2014-11-26: 400 mg via INTRAVENOUS

## 2014-11-26 MED ORDER — FENTANYL CITRATE (PF) 250 MCG/5ML IJ SOLN
INTRAMUSCULAR | Status: AC
  Filled 2014-11-26: qty 5

## 2014-11-26 MED ORDER — ROCURONIUM BROMIDE 50 MG/5ML IV SOLN
INTRAVENOUS | Status: AC
  Filled 2014-11-26: qty 1

## 2014-11-26 MED ORDER — CHLORHEXIDINE GLUCONATE 4 % EX LIQD: 1.0000 "application " | Freq: Once | CUTANEOUS | Status: DC

## 2014-11-26 MED ORDER — LACTATED RINGERS IV SOLN
INTRAVENOUS | Status: DC
  Administered 2014-11-26 (×2): via INTRAVENOUS

## 2014-11-26 MED ORDER — LIDOCAINE HCL (CARDIAC) 20 MG/ML IV SOLN
INTRAVENOUS | Status: DC | PRN
  Administered 2014-11-26: 40 mg via INTRAVENOUS

## 2014-11-26 MED ORDER — FENTANYL CITRATE (PF) 100 MCG/2ML IJ SOLN
INTRAMUSCULAR | Status: AC
  Filled 2014-11-26: qty 2

## 2014-11-26 MED ORDER — ROCURONIUM BROMIDE 100 MG/10ML IV SOLN
INTRAVENOUS | Status: DC | PRN
  Administered 2014-11-26: 5 mg via INTRAVENOUS
  Administered 2014-11-26: 30 mg via INTRAVENOUS

## 2014-11-26 MED ORDER — NEOSTIGMINE METHYLSULFATE 10 MG/10ML IV SOLN
INTRAVENOUS | Status: DC | PRN
  Administered 2014-11-26: 2.5 mg via INTRAVENOUS

## 2014-11-26 MED ORDER — ONDANSETRON HCL 4 MG/2ML IJ SOLN: 4.0000 mg | Freq: Once | INTRAMUSCULAR | Status: DC | PRN

## 2014-11-26 MED ORDER — PROPOFOL 10 MG/ML IV BOLUS
INTRAVENOUS | Status: DC | PRN
  Administered 2014-11-26: 150 mg via INTRAVENOUS

## 2014-11-26 MED ORDER — MIDAZOLAM HCL 2 MG/2ML IJ SOLN
INTRAMUSCULAR | Status: AC
  Filled 2014-11-26: qty 2

## 2014-11-26 MED ORDER — BUPIVACAINE HCL (PF) 0.5 % IJ SOLN
INTRAMUSCULAR | Status: DC | PRN
Start: 2014-11-26 — End: 2014-11-26
  Administered 2014-11-26: 10 mL

## 2014-11-26 MED ORDER — SODIUM CHLORIDE 0.9 % IR SOLN
Status: DC | PRN
  Administered 2014-11-26: 1000 mL

## 2014-11-26 MED ORDER — LIDOCAINE HCL (PF) 1 % IJ SOLN
INTRAMUSCULAR | Status: AC
  Filled 2014-11-26: qty 5

## 2014-11-26 MED ORDER — CIPROFLOXACIN IN D5W 400 MG/200ML IV SOLN
INTRAVENOUS | Status: AC
  Filled 2014-11-26: qty 200

## 2014-11-26 MED ORDER — POVIDONE-IODINE 10 % OINT PACKET
TOPICAL_OINTMENT | CUTANEOUS | Status: DC | PRN
  Administered 2014-11-26: 1 via TOPICAL

## 2014-11-26 MED ORDER — FENTANYL CITRATE (PF) 100 MCG/2ML IJ SOLN
25.0000 ug | INTRAMUSCULAR | Status: DC | PRN
  Administered 2014-11-26 (×2): 50 ug via INTRAVENOUS
  Filled 2014-11-26 (×2): qty 2

## 2014-11-26 MED ORDER — KETOROLAC TROMETHAMINE 30 MG/ML IJ SOLN
30.0000 mg | Freq: Once | INTRAMUSCULAR | Status: AC
  Administered 2014-11-26: 30 mg via INTRAVENOUS

## 2014-11-26 MED ORDER — BUPIVACAINE HCL (PF) 0.5 % IJ SOLN
INTRAMUSCULAR | Status: AC
  Filled 2014-11-26: qty 30

## 2014-11-26 MED ORDER — PHENYLEPHRINE HCL 10 MG/ML IJ SOLN
INTRAMUSCULAR | Status: DC | PRN
  Administered 2014-11-26: 40 ug via INTRAVENOUS

## 2014-11-26 MED ORDER — MIDAZOLAM HCL 2 MG/2ML IJ SOLN
1.0000 mg | INTRAMUSCULAR | Status: DC | PRN
  Administered 2014-11-26 (×3): 2 mg via INTRAVENOUS
  Filled 2014-11-26 (×2): qty 2

## 2014-11-26 MED ORDER — DEXAMETHASONE SODIUM PHOSPHATE 4 MG/ML IJ SOLN
4.0000 mg | Freq: Once | INTRAMUSCULAR | Status: AC
Start: 2014-11-26 — End: 2014-11-26
  Administered 2014-11-26: 4 mg via INTRAVENOUS
  Filled 2014-11-26: qty 1

## 2014-11-26 MED ORDER — FENTANYL CITRATE (PF) 100 MCG/2ML IJ SOLN
INTRAMUSCULAR | Status: DC | PRN
  Administered 2014-11-26 (×4): 50 ug via INTRAVENOUS

## 2014-11-26 MED ORDER — KETOROLAC TROMETHAMINE 30 MG/ML IJ SOLN
INTRAMUSCULAR | Status: AC
  Filled 2014-11-26: qty 1

## 2014-11-26 MED ORDER — GLYCOPYRROLATE 0.2 MG/ML IJ SOLN
INTRAMUSCULAR | Status: DC | PRN
  Administered 2014-11-26: .4 mg via INTRAVENOUS

## 2014-11-26 MED ORDER — ONDANSETRON HCL 4 MG/2ML IJ SOLN
4.0000 mg | Freq: Once | INTRAMUSCULAR | Status: AC
Start: 2014-11-26 — End: 2014-11-26
  Administered 2014-11-26: 4 mg via INTRAVENOUS

## 2014-11-26 MED ORDER — ONDANSETRON HCL 4 MG/2ML IJ SOLN
INTRAMUSCULAR | Status: AC
  Filled 2014-11-26: qty 2

## 2014-11-26 MED ORDER — POVIDONE-IODINE 10 % EX OINT
TOPICAL_OINTMENT | CUTANEOUS | Status: AC
  Filled 2014-11-26: qty 1

## 2014-11-26 MED ORDER — HEMOSTATIC AGENTS (NO CHARGE) OPTIME
TOPICAL | Status: DC | PRN
  Administered 2014-11-26: 1 via TOPICAL

## 2014-11-26 SURGICAL SUPPLY — 42 items
APPLIER CLIP LAPSCP 10X32 DD (CLIP) ×2 IMPLANT
BAG HAMPER (MISCELLANEOUS) ×2 IMPLANT
BAG SPEC RTRVL LRG 6X4 10 (ENDOMECHANICALS) ×1
CHLORAPREP W/TINT 26ML (MISCELLANEOUS) ×2 IMPLANT
CLOTH BEACON ORANGE TIMEOUT ST (SAFETY) ×2 IMPLANT
COVER LIGHT HANDLE STERIS (MISCELLANEOUS) ×4 IMPLANT
DECANTER SPIKE VIAL GLASS SM (MISCELLANEOUS) ×2 IMPLANT
ELECT REM PT RETURN 9FT ADLT (ELECTROSURGICAL) ×2
ELECTRODE REM PT RTRN 9FT ADLT (ELECTROSURGICAL) ×1 IMPLANT
FILTER SMOKE EVAC LAPAROSHD (FILTER) ×2 IMPLANT
FORMALIN 10 PREFIL 120ML (MISCELLANEOUS) ×2 IMPLANT
GLOVE BIOGEL PI IND STRL 7.0 (GLOVE) IMPLANT
GLOVE BIOGEL PI INDICATOR 7.0 (GLOVE) ×3
GLOVE SS N UNI LF 7.5 STRL (GLOVE) ×2 IMPLANT
GLOVE SURG SS PI 7.5 STRL IVOR (GLOVE) ×2 IMPLANT
GOWN STRL REUS W/ TWL XL LVL3 (GOWN DISPOSABLE) ×1 IMPLANT
GOWN STRL REUS W/TWL LRG LVL3 (GOWN DISPOSABLE) ×4 IMPLANT
GOWN STRL REUS W/TWL XL LVL3 (GOWN DISPOSABLE) ×2
HEMOSTAT SNOW SURGICEL 2X4 (HEMOSTASIS) ×2 IMPLANT
INST SET LAPROSCOPIC AP (KITS) ×2 IMPLANT
IV NS IRRIG 3000ML ARTHROMATIC (IV SOLUTION) IMPLANT
KIT ROOM TURNOVER APOR (KITS) ×2 IMPLANT
MANIFOLD NEPTUNE II (INSTRUMENTS) ×2 IMPLANT
NDL INSUFFLATION 14GA 120MM (NEEDLE) ×1 IMPLANT
NEEDLE INSUFFLATION 14GA 120MM (NEEDLE) ×2 IMPLANT
NS IRRIG 1000ML POUR BTL (IV SOLUTION) ×2 IMPLANT
PACK LAP CHOLE LZT030E (CUSTOM PROCEDURE TRAY) ×2 IMPLANT
PAD ARMBOARD 7.5X6 YLW CONV (MISCELLANEOUS) ×2 IMPLANT
POUCH SPECIMEN RETRIEVAL 10MM (ENDOMECHANICALS) ×2 IMPLANT
SET BASIN LINEN APH (SET/KITS/TRAYS/PACK) ×2 IMPLANT
SET TUBE IRRIG SUCTION NO TIP (IRRIGATION / IRRIGATOR) IMPLANT
SLEEVE ENDOPATH XCEL 5M (ENDOMECHANICALS) ×2 IMPLANT
SPONGE GAUZE 2X2 8PLY STRL LF (GAUZE/BANDAGES/DRESSINGS) ×8 IMPLANT
STAPLER VISISTAT (STAPLE) ×2 IMPLANT
SUT VICRYL 0 UR6 27IN ABS (SUTURE) ×2 IMPLANT
TAPE CLOTH SURG 4X10 WHT LF (GAUZE/BANDAGES/DRESSINGS) ×1 IMPLANT
TROCAR ENDO BLADELESS 11MM (ENDOMECHANICALS) ×2 IMPLANT
TROCAR XCEL NON-BLD 5MMX100MML (ENDOMECHANICALS) ×2 IMPLANT
TROCAR XCEL UNIV SLVE 11M 100M (ENDOMECHANICALS) ×2 IMPLANT
TUBING INSUFFLATION (TUBING) ×2 IMPLANT
WARMER LAPAROSCOPE (MISCELLANEOUS) ×2 IMPLANT
YANKAUER SUCT 12FT TUBE ARGYLE (SUCTIONS) ×2 IMPLANT

## 2014-11-26 NOTE — Anesthesia Preprocedure Evaluation (Signed)
Anesthesia Evaluation  Patient identified by MRN, date of birth, ID band Patient awake    Reviewed: Allergy & Precautions, NPO status , Patient's Chart, lab work & pertinent test results  Airway Mallampati: II  TM Distance: >3 FB     Dental  (+) Teeth Intact   Pulmonary neg pulmonary ROS,  breath sounds clear to auscultation        Cardiovascular hypertension, Pt. on medications Rhythm:Regular Rate:Normal     Neuro/Psych PSYCHIATRIC DISORDERS Anxiety Depression    GI/Hepatic   Endo/Other  Hypothyroidism   Renal/GU      Musculoskeletal   Abdominal   Peds  Hematology   Anesthesia Other Findings   Reproductive/Obstetrics                             Anesthesia Physical Anesthesia Plan  ASA: II  Anesthesia Plan: General   Post-op Pain Management:    Induction: Intravenous  Airway Management Planned: Oral ETT  Additional Equipment:   Intra-op Plan:   Post-operative Plan: Extubation in OR  Informed Consent: I have reviewed the patients History and Physical, chart, labs and discussed the procedure including the risks, benefits and alternatives for the proposed anesthesia with the patient or authorized representative who has indicated his/her understanding and acceptance.     Plan Discussed with:   Anesthesia Plan Comments:         Anesthesia Quick Evaluation

## 2014-11-26 NOTE — Discharge Instructions (Signed)

## 2014-11-26 NOTE — Transfer of Care (Signed)
Immediate Anesthesia Transfer of Care Note  Patient: Rebekah Gonzalez  Procedure(s) Performed: Procedure(s): LAPAROSCOPIC CHOLECYSTECTOMY (N/A)  Patient Location: PACU  Anesthesia Type:General  Level of Consciousness: awake  Airway & Oxygen Therapy: Patient Spontanous Breathing and Patient connected to face mask oxygen  Post-op Assessment: Report given to RN and Post -op Vital signs reviewed and stable  Post vital signs: Reviewed and stable  Last Vitals:  Filed Vitals:   11/26/14 0745  BP: 127/77  Pulse:   Temp:   Resp: 20    Complications: No apparent anesthesia complications

## 2014-11-26 NOTE — Op Note (Signed)
Patient:  Rebekah Gonzalez  DOB:  1956-01-08  MRN:  [id number]   Preop Diagnosis:  Cholelithiasis, biliary colic  Postop Diagnosis:  Same  Procedure:  Laparoscopic cholecystectomy  Surgeon:  Aviva Signs, M.D.  Anes:  Gen. endotracheal  Indications:  Patient is a 59 year old white female who presents with biliary colic secondary to cholelithiasis. The risks and benefits of the procedure including bleeding, infection, hepatobiliary injury, and the possibility of an open procedure were fully explained to the patient, who gave informed consent.  Procedure note:  The patient was placed in the supine position. After induction of general endotracheal anesthesia, the abdomen was prepped and draped using the usual sterile technique with DuraPrep. Surgical site confirmation was performed.  A supraumbilical incision was made down the fascia. A Veress needle was introduced into the abdominal cavity and confirmation of placement was done using the saline drop test. The abdomen was then insufflated to 16 mmHg pressure. An 11 mm trocar was introduced into the abdominal cavity under direct visualization without difficulty. The patient was placed in reverse Trendelenburg position and additional 11 mm trocar was placed the epigastric region 5 mm trochars were placed the right upper quadrant and right flank regions. Liver was inspected and noted to be within normal limits. The gallbladder was retracted in a dynamic fashion in order to expose the triangle of Calot. The cystic duct was first identified. Its junction to the infundibulum was fully identified. Endoclips were placed proximally and distally on the cystic duct, and the cystic duct was divided. This was likewise done to the cystic artery. The gallbladder was freed away from the gallbladder fossa using Bovie electrocautery. The gallbladder was delivered to the epigastric trocar site using an Endo Catch bag. The gallbladder fossa was inspected and no abnormal  bleeding or bile leakage was noted. Surgicel is placed the gallbladder fossa. All fluid and air were then evacuated from the abdominal cavity prior to removal of the trochars.  All wounds were irrigated with normal saline. All wounds were injected with 0.5% Sensorcaine. The supraumbilical fascia as well as epigastric fascia were reapproximated using 0 Vicryl interrupted sutures. All skin incisions were closed using staples. Betadine ointment and dry sterile dressings were applied.  All tape and needle counts were correct the end of the procedure. Patient was extubated in the operating room and transferred to PACU in stable condition.  Complications:  None  EBL:  Minimal  Specimen:  None

## 2014-11-26 NOTE — Anesthesia Procedure Notes (Signed)
Procedure Name: Intubation Date/Time: 11/26/2014 8:35 AM Performed by: Tressie Stalker E Pre-anesthesia Checklist: Patient identified, Patient being monitored, Timeout performed, Emergency Drugs available and Suction available Patient Re-evaluated:Patient Re-evaluated prior to inductionOxygen Delivery Method: Circle System Utilized Preoxygenation: Pre-oxygenation with 100% oxygen Intubation Type: IV induction Ventilation: Mask ventilation without difficulty Laryngoscope Size: Mac and 3 Grade View: Grade I Tube type: Oral Tube size: 7.0 mm Number of attempts: 1 Airway Equipment and Method: Stylet Placement Confirmation: ETT inserted through vocal cords under direct vision,  positive ETCO2 and breath sounds checked- equal and bilateral Secured at: 21 cm Tube secured with: Tape Dental Injury: Teeth and Oropharynx as per pre-operative assessment

## 2014-11-26 NOTE — Anesthesia Postprocedure Evaluation (Signed)
  Anesthesia Post-op Note  Patient: Rebekah Gonzalez  Procedure(s) Performed: Procedure(s): LAPAROSCOPIC CHOLECYSTECTOMY (N/A)  Patient Location: Short Stay  Anesthesia Type:General  Level of Consciousness: awake, alert  and oriented  Airway and Oxygen Therapy: Patient Spontanous Breathing  Post-op Pain: mild  Post-op Assessment: Post-op Vital signs reviewed, Patient's Cardiovascular Status Stable, Respiratory Function Stable, Patent Airway and No signs of Nausea or vomiting              Post-op Vital Signs: Reviewed and stable  Last Vitals:  Filed Vitals:   11/26/14 1034  BP: 131/65  Pulse: 64  Temp: 36.8 C  Resp: 16    Complications: No apparent anesthesia complications

## 2014-11-26 NOTE — Interval H&P Note (Signed)
History and Physical Interval Note:  11/26/2014 8:19 AM  Rebekah Gonzalez  has presented today for surgery, with the diagnosis of cholelithiasis  The various methods of treatment have been discussed with the patient and family. After consideration of risks, benefits and other options for treatment, the patient has consented to  Procedure(s): LAPAROSCOPIC CHOLECYSTECTOMY (N/A) as a surgical intervention .  The patient's history has been reviewed, patient examined, no change in status, stable for surgery.  I have reviewed the patient's chart and labs.  Questions were answered to the patient's satisfaction.     Aviva Signs A

## 2014-11-28 ENCOUNTER — Encounter (HOSPITAL_COMMUNITY): Payer: Self-pay | Admitting: General Surgery

## 2015-01-20 DIAGNOSIS — F419 Anxiety disorder, unspecified: Secondary | ICD-10-CM | POA: Diagnosis not present

## 2015-01-20 DIAGNOSIS — I10 Essential (primary) hypertension: Secondary | ICD-10-CM | POA: Diagnosis not present

## 2015-01-20 DIAGNOSIS — E063 Autoimmune thyroiditis: Secondary | ICD-10-CM | POA: Diagnosis not present

## 2015-01-20 DIAGNOSIS — Z6827 Body mass index (BMI) 27.0-27.9, adult: Secondary | ICD-10-CM | POA: Diagnosis not present

## 2015-05-28 DIAGNOSIS — Z1389 Encounter for screening for other disorder: Secondary | ICD-10-CM | POA: Diagnosis not present

## 2015-05-28 DIAGNOSIS — E663 Overweight: Secondary | ICD-10-CM | POA: Diagnosis not present

## 2015-05-28 DIAGNOSIS — I10 Essential (primary) hypertension: Secondary | ICD-10-CM | POA: Diagnosis not present

## 2015-05-28 DIAGNOSIS — E063 Autoimmune thyroiditis: Secondary | ICD-10-CM | POA: Diagnosis not present

## 2015-05-28 DIAGNOSIS — Z6827 Body mass index (BMI) 27.0-27.9, adult: Secondary | ICD-10-CM | POA: Diagnosis not present

## 2015-11-24 DIAGNOSIS — E039 Hypothyroidism, unspecified: Secondary | ICD-10-CM | POA: Diagnosis not present

## 2015-11-24 DIAGNOSIS — R635 Abnormal weight gain: Secondary | ICD-10-CM | POA: Diagnosis not present

## 2015-11-24 DIAGNOSIS — E063 Autoimmune thyroiditis: Secondary | ICD-10-CM | POA: Diagnosis not present

## 2015-11-24 DIAGNOSIS — F419 Anxiety disorder, unspecified: Secondary | ICD-10-CM | POA: Diagnosis not present

## 2015-11-24 DIAGNOSIS — K219 Gastro-esophageal reflux disease without esophagitis: Secondary | ICD-10-CM | POA: Diagnosis not present

## 2015-11-24 DIAGNOSIS — Z6827 Body mass index (BMI) 27.0-27.9, adult: Secondary | ICD-10-CM | POA: Diagnosis not present

## 2015-11-24 DIAGNOSIS — Z1389 Encounter for screening for other disorder: Secondary | ICD-10-CM | POA: Diagnosis not present

## 2015-11-24 DIAGNOSIS — I10 Essential (primary) hypertension: Secondary | ICD-10-CM | POA: Diagnosis not present

## 2016-02-05 DIAGNOSIS — Z1389 Encounter for screening for other disorder: Secondary | ICD-10-CM | POA: Diagnosis not present

## 2016-02-05 DIAGNOSIS — E663 Overweight: Secondary | ICD-10-CM | POA: Diagnosis not present

## 2016-02-05 DIAGNOSIS — Z6828 Body mass index (BMI) 28.0-28.9, adult: Secondary | ICD-10-CM | POA: Diagnosis not present

## 2016-02-05 DIAGNOSIS — Z0000 Encounter for general adult medical examination without abnormal findings: Secondary | ICD-10-CM | POA: Diagnosis not present

## 2016-05-11 DIAGNOSIS — F329 Major depressive disorder, single episode, unspecified: Secondary | ICD-10-CM | POA: Diagnosis not present

## 2016-05-11 DIAGNOSIS — E663 Overweight: Secondary | ICD-10-CM | POA: Diagnosis not present

## 2016-05-11 DIAGNOSIS — Z1389 Encounter for screening for other disorder: Secondary | ICD-10-CM | POA: Diagnosis not present

## 2016-05-11 DIAGNOSIS — Z23 Encounter for immunization: Secondary | ICD-10-CM | POA: Diagnosis not present

## 2016-05-11 DIAGNOSIS — Z6829 Body mass index (BMI) 29.0-29.9, adult: Secondary | ICD-10-CM | POA: Diagnosis not present

## 2016-05-11 DIAGNOSIS — F419 Anxiety disorder, unspecified: Secondary | ICD-10-CM | POA: Diagnosis not present

## 2016-05-11 DIAGNOSIS — E063 Autoimmune thyroiditis: Secondary | ICD-10-CM | POA: Diagnosis not present

## 2017-07-14 DIAGNOSIS — Z1389 Encounter for screening for other disorder: Secondary | ICD-10-CM | POA: Diagnosis not present

## 2017-07-14 DIAGNOSIS — E063 Autoimmune thyroiditis: Secondary | ICD-10-CM | POA: Diagnosis not present

## 2017-07-14 DIAGNOSIS — R69 Illness, unspecified: Secondary | ICD-10-CM | POA: Diagnosis not present

## 2017-07-14 DIAGNOSIS — Z6829 Body mass index (BMI) 29.0-29.9, adult: Secondary | ICD-10-CM | POA: Diagnosis not present

## 2017-07-14 DIAGNOSIS — M47814 Spondylosis without myelopathy or radiculopathy, thoracic region: Secondary | ICD-10-CM | POA: Diagnosis not present

## 2017-10-16 DIAGNOSIS — Z6829 Body mass index (BMI) 29.0-29.9, adult: Secondary | ICD-10-CM | POA: Diagnosis not present

## 2017-10-16 DIAGNOSIS — Z1389 Encounter for screening for other disorder: Secondary | ICD-10-CM | POA: Diagnosis not present

## 2017-10-16 DIAGNOSIS — I10 Essential (primary) hypertension: Secondary | ICD-10-CM | POA: Diagnosis not present

## 2017-10-16 DIAGNOSIS — K219 Gastro-esophageal reflux disease without esophagitis: Secondary | ICD-10-CM | POA: Diagnosis not present

## 2017-10-16 DIAGNOSIS — R69 Illness, unspecified: Secondary | ICD-10-CM | POA: Diagnosis not present

## 2017-10-16 DIAGNOSIS — R1084 Generalized abdominal pain: Secondary | ICD-10-CM | POA: Diagnosis not present

## 2017-10-16 DIAGNOSIS — E663 Overweight: Secondary | ICD-10-CM | POA: Diagnosis not present

## 2017-10-16 DIAGNOSIS — E049 Nontoxic goiter, unspecified: Secondary | ICD-10-CM | POA: Diagnosis not present

## 2017-10-16 DIAGNOSIS — R1313 Dysphagia, pharyngeal phase: Secondary | ICD-10-CM | POA: Diagnosis not present

## 2017-10-16 DIAGNOSIS — R109 Unspecified abdominal pain: Secondary | ICD-10-CM | POA: Diagnosis not present

## 2017-10-17 DIAGNOSIS — G4700 Insomnia, unspecified: Secondary | ICD-10-CM | POA: Diagnosis not present

## 2017-10-17 DIAGNOSIS — K08409 Partial loss of teeth, unspecified cause, unspecified class: Secondary | ICD-10-CM | POA: Diagnosis not present

## 2017-10-17 DIAGNOSIS — R69 Illness, unspecified: Secondary | ICD-10-CM | POA: Diagnosis not present

## 2017-10-17 DIAGNOSIS — I10 Essential (primary) hypertension: Secondary | ICD-10-CM | POA: Diagnosis not present

## 2017-10-17 DIAGNOSIS — G8929 Other chronic pain: Secondary | ICD-10-CM | POA: Diagnosis not present

## 2017-10-17 DIAGNOSIS — E039 Hypothyroidism, unspecified: Secondary | ICD-10-CM | POA: Diagnosis not present

## 2017-10-17 DIAGNOSIS — J45909 Unspecified asthma, uncomplicated: Secondary | ICD-10-CM | POA: Diagnosis not present

## 2017-10-17 DIAGNOSIS — Z794 Long term (current) use of insulin: Secondary | ICD-10-CM | POA: Diagnosis not present

## 2017-10-17 DIAGNOSIS — E785 Hyperlipidemia, unspecified: Secondary | ICD-10-CM | POA: Diagnosis not present

## 2017-10-27 DIAGNOSIS — K219 Gastro-esophageal reflux disease without esophagitis: Secondary | ICD-10-CM | POA: Diagnosis not present

## 2017-10-27 DIAGNOSIS — K449 Diaphragmatic hernia without obstruction or gangrene: Secondary | ICD-10-CM | POA: Diagnosis not present

## 2017-10-27 DIAGNOSIS — K224 Dyskinesia of esophagus: Secondary | ICD-10-CM | POA: Diagnosis not present

## 2017-10-27 DIAGNOSIS — Z1389 Encounter for screening for other disorder: Secondary | ICD-10-CM | POA: Diagnosis not present

## 2017-10-31 DIAGNOSIS — Z1389 Encounter for screening for other disorder: Secondary | ICD-10-CM | POA: Diagnosis not present

## 2017-10-31 DIAGNOSIS — R945 Abnormal results of liver function studies: Secondary | ICD-10-CM | POA: Diagnosis not present

## 2017-10-31 DIAGNOSIS — E049 Nontoxic goiter, unspecified: Secondary | ICD-10-CM | POA: Diagnosis not present

## 2017-10-31 DIAGNOSIS — K760 Fatty (change of) liver, not elsewhere classified: Secondary | ICD-10-CM | POA: Diagnosis not present

## 2017-10-31 DIAGNOSIS — Z9049 Acquired absence of other specified parts of digestive tract: Secondary | ICD-10-CM | POA: Diagnosis not present

## 2017-11-08 ENCOUNTER — Encounter: Payer: Self-pay | Admitting: Gastroenterology

## 2018-01-24 ENCOUNTER — Ambulatory Visit: Payer: Medicare Other | Admitting: Gastroenterology

## 2018-01-30 DIAGNOSIS — Z1231 Encounter for screening mammogram for malignant neoplasm of breast: Secondary | ICD-10-CM | POA: Diagnosis not present

## 2018-02-01 DIAGNOSIS — E669 Obesity, unspecified: Secondary | ICD-10-CM | POA: Diagnosis not present

## 2018-02-01 DIAGNOSIS — E663 Overweight: Secondary | ICD-10-CM | POA: Diagnosis not present

## 2018-02-01 DIAGNOSIS — E782 Mixed hyperlipidemia: Secondary | ICD-10-CM | POA: Diagnosis not present

## 2018-02-01 DIAGNOSIS — Z6829 Body mass index (BMI) 29.0-29.9, adult: Secondary | ICD-10-CM | POA: Diagnosis not present

## 2018-02-01 DIAGNOSIS — Z1389 Encounter for screening for other disorder: Secondary | ICD-10-CM | POA: Diagnosis not present

## 2018-02-01 DIAGNOSIS — E063 Autoimmune thyroiditis: Secondary | ICD-10-CM | POA: Diagnosis not present

## 2018-02-01 DIAGNOSIS — R635 Abnormal weight gain: Secondary | ICD-10-CM | POA: Diagnosis not present

## 2018-02-01 DIAGNOSIS — I10 Essential (primary) hypertension: Secondary | ICD-10-CM | POA: Diagnosis not present

## 2018-04-26 DIAGNOSIS — Z1389 Encounter for screening for other disorder: Secondary | ICD-10-CM | POA: Diagnosis not present

## 2018-04-26 DIAGNOSIS — K219 Gastro-esophageal reflux disease without esophagitis: Secondary | ICD-10-CM | POA: Diagnosis not present

## 2018-04-26 DIAGNOSIS — E063 Autoimmune thyroiditis: Secondary | ICD-10-CM | POA: Diagnosis not present

## 2018-04-26 DIAGNOSIS — Z6830 Body mass index (BMI) 30.0-30.9, adult: Secondary | ICD-10-CM | POA: Diagnosis not present

## 2018-04-26 DIAGNOSIS — Z23 Encounter for immunization: Secondary | ICD-10-CM | POA: Diagnosis not present

## 2018-04-26 DIAGNOSIS — Z0001 Encounter for general adult medical examination with abnormal findings: Secondary | ICD-10-CM | POA: Diagnosis not present

## 2018-04-26 DIAGNOSIS — I10 Essential (primary) hypertension: Secondary | ICD-10-CM | POA: Diagnosis not present

## 2018-07-30 DIAGNOSIS — Z1389 Encounter for screening for other disorder: Secondary | ICD-10-CM | POA: Diagnosis not present

## 2018-07-30 DIAGNOSIS — Z6830 Body mass index (BMI) 30.0-30.9, adult: Secondary | ICD-10-CM | POA: Diagnosis not present

## 2018-07-30 DIAGNOSIS — K219 Gastro-esophageal reflux disease without esophagitis: Secondary | ICD-10-CM | POA: Diagnosis not present

## 2018-07-30 DIAGNOSIS — J329 Chronic sinusitis, unspecified: Secondary | ICD-10-CM | POA: Diagnosis not present

## 2018-07-30 DIAGNOSIS — K529 Noninfective gastroenteritis and colitis, unspecified: Secondary | ICD-10-CM | POA: Diagnosis not present

## 2018-07-30 DIAGNOSIS — E063 Autoimmune thyroiditis: Secondary | ICD-10-CM | POA: Diagnosis not present

## 2018-07-30 DIAGNOSIS — E6609 Other obesity due to excess calories: Secondary | ICD-10-CM | POA: Diagnosis not present

## 2018-07-31 ENCOUNTER — Other Ambulatory Visit (HOSPITAL_COMMUNITY): Payer: Self-pay | Admitting: Internal Medicine

## 2018-07-31 ENCOUNTER — Other Ambulatory Visit: Payer: Self-pay | Admitting: Internal Medicine

## 2018-08-01 ENCOUNTER — Telehealth (HOSPITAL_COMMUNITY): Payer: Self-pay

## 2018-08-01 ENCOUNTER — Other Ambulatory Visit (HOSPITAL_COMMUNITY): Payer: Self-pay | Admitting: Internal Medicine

## 2018-08-01 DIAGNOSIS — R1310 Dysphagia, unspecified: Secondary | ICD-10-CM

## 2018-08-01 DIAGNOSIS — R109 Unspecified abdominal pain: Secondary | ICD-10-CM

## 2018-08-07 ENCOUNTER — Ambulatory Visit (HOSPITAL_COMMUNITY)
Admission: RE | Admit: 2018-08-07 | Discharge: 2018-08-07 | Disposition: A | Discharge status: Home / Self Care | Payer: Medicare HMO | Source: Ambulatory Visit | Attending: Internal Medicine | Admitting: Internal Medicine

## 2018-08-07 DIAGNOSIS — R109 Unspecified abdominal pain: Secondary | ICD-10-CM | POA: Diagnosis present

## 2018-08-07 DIAGNOSIS — K7689 Other specified diseases of liver: Secondary | ICD-10-CM | POA: Diagnosis not present

## 2018-08-07 DIAGNOSIS — R1310 Dysphagia, unspecified: Secondary | ICD-10-CM | POA: Diagnosis present

## 2018-08-07 DIAGNOSIS — R0989 Other specified symptoms and signs involving the circulatory and respiratory systems: Secondary | ICD-10-CM | POA: Diagnosis not present

## 2018-10-12 ENCOUNTER — Other Ambulatory Visit (HOSPITAL_COMMUNITY): Payer: Self-pay | Admitting: Internal Medicine

## 2018-10-12 DIAGNOSIS — R05 Cough: Secondary | ICD-10-CM | POA: Diagnosis not present

## 2018-10-12 DIAGNOSIS — R059 Cough, unspecified: Secondary | ICD-10-CM

## 2018-10-12 DIAGNOSIS — Z6830 Body mass index (BMI) 30.0-30.9, adult: Secondary | ICD-10-CM | POA: Diagnosis not present

## 2018-10-12 DIAGNOSIS — Z1389 Encounter for screening for other disorder: Secondary | ICD-10-CM | POA: Diagnosis not present

## 2018-10-12 DIAGNOSIS — K219 Gastro-esophageal reflux disease without esophagitis: Secondary | ICD-10-CM | POA: Diagnosis not present

## 2018-10-14 ENCOUNTER — Ambulatory Visit (HOSPITAL_COMMUNITY): Admission: RE | Admit: 2018-10-14 | Payer: Medicare HMO | Source: Ambulatory Visit

## 2018-12-18 DIAGNOSIS — K219 Gastro-esophageal reflux disease without esophagitis: Secondary | ICD-10-CM | POA: Diagnosis not present

## 2018-12-18 DIAGNOSIS — I10 Essential (primary) hypertension: Secondary | ICD-10-CM | POA: Diagnosis not present

## 2018-12-18 DIAGNOSIS — E063 Autoimmune thyroiditis: Secondary | ICD-10-CM | POA: Diagnosis not present

## 2018-12-18 DIAGNOSIS — R109 Unspecified abdominal pain: Secondary | ICD-10-CM | POA: Diagnosis not present

## 2018-12-18 DIAGNOSIS — Z6830 Body mass index (BMI) 30.0-30.9, adult: Secondary | ICD-10-CM | POA: Diagnosis not present

## 2018-12-19 ENCOUNTER — Other Ambulatory Visit (HOSPITAL_COMMUNITY): Payer: Self-pay | Admitting: Internal Medicine

## 2018-12-19 ENCOUNTER — Other Ambulatory Visit: Payer: Self-pay

## 2018-12-19 ENCOUNTER — Ambulatory Visit (HOSPITAL_COMMUNITY)
Admission: RE | Admit: 2018-12-19 | Discharge: 2018-12-19 | Disposition: A | Discharge status: Home / Self Care | Payer: Medicare HMO | Source: Ambulatory Visit | Attending: Internal Medicine | Admitting: Internal Medicine

## 2018-12-19 DIAGNOSIS — R0602 Shortness of breath: Secondary | ICD-10-CM | POA: Diagnosis not present

## 2018-12-19 DIAGNOSIS — K219 Gastro-esophageal reflux disease without esophagitis: Secondary | ICD-10-CM | POA: Diagnosis not present

## 2018-12-19 DIAGNOSIS — E782 Mixed hyperlipidemia: Secondary | ICD-10-CM | POA: Diagnosis not present

## 2018-12-19 DIAGNOSIS — R05 Cough: Secondary | ICD-10-CM

## 2018-12-19 DIAGNOSIS — Z79899 Other long term (current) drug therapy: Secondary | ICD-10-CM | POA: Diagnosis not present

## 2018-12-19 DIAGNOSIS — R059 Cough, unspecified: Secondary | ICD-10-CM

## 2018-12-26 ENCOUNTER — Telehealth: Payer: Self-pay | Admitting: *Deleted

## 2018-12-26 NOTE — Telephone Encounter (Signed)
REFERRAL SENT TO SCHEDULING AND NOTES ON FILE FROM [facility] [phone] EXT [address] DR[address]WR[address] [employer]

## 2019-01-07 ENCOUNTER — Encounter: Payer: Self-pay | Admitting: Cardiovascular Disease

## 2019-01-08 ENCOUNTER — Telehealth: Payer: Self-pay

## 2019-01-08 NOTE — Telephone Encounter (Signed)
FAXED NOTES TO [facility] 

## 2019-01-15 ENCOUNTER — Ambulatory Visit: Payer: Medicare HMO | Admitting: Cardiovascular Disease

## 2019-01-16 ENCOUNTER — Encounter: Payer: Self-pay | Admitting: *Deleted

## 2019-01-17 ENCOUNTER — Ambulatory Visit (INDEPENDENT_AMBULATORY_CARE_PROVIDER_SITE_OTHER): Payer: Medicare HMO | Admitting: Cardiovascular Disease

## 2019-01-17 ENCOUNTER — Other Ambulatory Visit: Payer: Self-pay

## 2019-01-17 ENCOUNTER — Encounter: Payer: Self-pay | Admitting: *Deleted

## 2019-01-17 ENCOUNTER — Encounter: Payer: Self-pay | Admitting: Cardiovascular Disease

## 2019-01-17 VITALS — BP 117/82 | HR 82 | Ht 63.0 in | Wt 180.6 lb

## 2019-01-17 DIAGNOSIS — R0600 Dyspnea, unspecified: Secondary | ICD-10-CM

## 2019-01-17 DIAGNOSIS — R0609 Other forms of dyspnea: Secondary | ICD-10-CM | POA: Diagnosis not present

## 2019-01-17 DIAGNOSIS — I10 Essential (primary) hypertension: Secondary | ICD-10-CM

## 2019-01-17 NOTE — Progress Notes (Signed)
CARDIOLOGY CONSULT NOTE  Patient ID: Rebekah Gonzalez MRN: [id number] DOB/AGE: [birth year] 63 y.o.  Admit date: (Not on file) Primary Physician: Redmond School, MD  Reason for Consultation: Shortness of breath  HPI: Rebekah Gonzalez is a 63 y.o. female who is being seen today for the evaluation of shortness of breath at the request of Redmond School, MD.   Chest x-ray on 12/19/2018 showed no active cardiopulmonary disease.  I personally reviewed the ECG which I ordered today which demonstrates normal sinus rhythm with no ischemic ST segment or T wave abnormalities.  She was sick with some sort of pulmonary infection which began in December and lasted through January of this year.  She thinks she took azithromycin at least on 3 separate occasions.  She thinks she also received IV steroids.  She is not sure if she may have had COVID as testing was not going [place] at that time.  Ever since then she has had exertional dyspnea when walking to her mailbox and back.  She has had a mild degree of chest heaviness.  She also describes bilateral inframammary cramps which can occur when she is sitting on her couch or when getting out of the bathtub and moving positions.  She has had some mild bilateral ankle swelling which has gone on for at least 2 years.  She denies fevers and chills.  She did have a persistent cough which lasted months but this has since resolved.  She does have some degree of orthopnea but denies paroxysmal nocturnal dyspnea.   Allergies  Allergen Reactions  . Citalopram Hydrobromide   . Codeine     REACTION: itch/rash (tolerates hydrocodone)  . Eszopiclone   . Paroxetine   . Sertraline Hcl     Current Outpatient Medications  Medication Sig Dispense Refill  . albuterol (VENTOLIN HFA) 108 (90 Base) MCG/ACT inhaler Inhale 1-2 puffs into the lungs every 4 (four) hours as needed.    . ALPRAZolam (XANAX) 1 MG tablet Take 1 mg by mouth 2 (two) times  daily.   2  . atorvastatin (LIPITOR) 10 MG tablet Take 1 tablet by mouth daily.    . benzonatate (TESSALON) 100 MG capsule Take 1 capsule by mouth 4 (four) times daily as needed.    Marland Kitchen buPROPion (WELLBUTRIN) 100 MG tablet Take 100 mg by mouth 3 (three) times daily.  5  . cholestyramine (QUESTRAN) 4 GM/DOSE powder Take 1 packet by mouth QID.    . furosemide (LASIX) 20 MG tablet Take 20 mg by mouth 4 (four) times daily.    Marland Kitchen gabapentin (NEURONTIN) 100 MG capsule Take 100 mg by mouth 3 (three) times daily.    Marland Kitchen ibuprofen (ADVIL,MOTRIN) 800 MG tablet Take 800 mg by mouth 3 (three) times daily.    Marland Kitchen levothyroxine (SYNTHROID, LEVOTHROID) 75 MCG tablet Take 75 mcg by mouth daily.  11  . losartan (COZAAR) 50 MG tablet Take 1 tablet by mouth daily.    . mirtazapine (REMERON) 30 MG tablet Take 30 mg by mouth at bedtime.    . pantoprazole (PROTONIX) 40 MG tablet Take 40 mg by mouth 2 (two) times daily.     No current facility-administered medications for this visit.     Past Medical History:  Diagnosis Date  . Anxiety   . Depression   . Hypertension   . Hypothyroidism     Past Surgical History:  Procedure Laterality Date  . APPENDECTOMY    .  CHOLECYSTECTOMY N/A 11/26/2014   Procedure: LAPAROSCOPIC CHOLECYSTECTOMY;  Surgeon: Aviva Signs Md, MD;  Location: [facility];  Service: General;  Laterality: N/A;  . TUBAL LIGATION      Social History   Socioeconomic History  . Marital status: Married    Spouse name: Not on file  . Number of children: Not on file  . Years of education: Not on file  . Highest education level: Not on file  Occupational History  . Not on file  Social Needs  . Financial resource strain: Not on file  . Food insecurity    Worry: Not on file    Inability: Not on file  . Transportation needs    Medical: Not on file    Non-medical: Not on file  Tobacco Use  . Smoking status: Never Smoker  . Smokeless tobacco: Never Used  Substance and Sexual Activity  . Alcohol use:  Yes    Alcohol/week: 1.0 standard drinks    Types: 1 Glasses of wine per week  . Drug use: No  . Sexual activity: Yes    Birth control/protection: Post-menopausal  Lifestyle  . Physical activity    Days per week: Not on file    Minutes per session: Not on file  . Stress: Not on file  Relationships  . Social Herbalist on phone: Not on file    Gets together: Not on file    Attends religious service: Not on file    Active member of club or organization: Not on file    Attends meetings of clubs or organizations: Not on file    Relationship status: Not on file  . Intimate partner violence    Fear of current or ex partner: Not on file    Emotionally abused: Not on file    Physically abused: Not on file    Forced sexual activity: Not on file  Other Topics Concern  . Not on file  Social History Narrative  . Not on file     No family history of premature CAD in 1st degree relatives.  Current Meds  Medication Sig  . albuterol (VENTOLIN HFA) 108 (90 Base) MCG/ACT inhaler Inhale 1-2 puffs into the lungs every 4 (four) hours as needed.  . ALPRAZolam (XANAX) 1 MG tablet Take 1 mg by mouth 2 (two) times daily.   Marland Kitchen atorvastatin (LIPITOR) 10 MG tablet Take 1 tablet by mouth daily.  . benzonatate (TESSALON) 100 MG capsule Take 1 capsule by mouth 4 (four) times daily as needed.  Marland Kitchen buPROPion (WELLBUTRIN) 100 MG tablet Take 100 mg by mouth 3 (three) times daily.  . cholestyramine (QUESTRAN) 4 GM/DOSE powder Take 1 packet by mouth QID.  . furosemide (LASIX) 20 MG tablet Take 20 mg by mouth 4 (four) times daily.  Marland Kitchen gabapentin (NEURONTIN) 100 MG capsule Take 100 mg by mouth 3 (three) times daily.  Marland Kitchen ibuprofen (ADVIL,MOTRIN) 800 MG tablet Take 800 mg by mouth 3 (three) times daily.  Marland Kitchen levothyroxine (SYNTHROID, LEVOTHROID) 75 MCG tablet Take 75 mcg by mouth daily.  Marland Kitchen losartan (COZAAR) 50 MG tablet Take 1 tablet by mouth daily.  . mirtazapine (REMERON) 30 MG tablet Take 30 mg by mouth  at bedtime.  . pantoprazole (PROTONIX) 40 MG tablet Take 40 mg by mouth 2 (two) times daily.      Review of systems complete and found to be negative unless listed above in HPI    Physical exam Blood pressure 117/82, pulse 82, height 5'  3" (1.6 m), weight 180 lb 9.6 oz (81.9 kg), SpO2 99 %. General: NAD Neck: No JVD, no thyromegaly or thyroid nodule.  Lungs: Clear to auscultation bilaterally with normal respiratory effort. CV: Nondisplaced PMI. Regular rate and rhythm, normal S1/S2, no S3/S4, no murmur.  No peripheral edema.  No carotid bruit.    Abdomen: Soft, nontender, no distention.  Skin: Intact without lesions or rashes.  Neurologic: Alert and oriented x 3.  Psych: Normal affect. Extremities: No clubbing or cyanosis.  HEENT: Normal.   ECG: Most recent ECG reviewed.   Labs: Lab Results  Component Value Date/Time   K 4.1 11/21/2014 11:10 AM   BUN 19 11/21/2014 11:10 AM   CREATININE 0.84 11/21/2014 11:10 AM   ALT 86 (H) 11/21/2014 11:10 AM   HGB 13.8 11/21/2014 11:10 AM     Lipids: No results found for: LDLCALC, LDLDIRECT, CHOL, TRIG, HDL      ASSESSMENT AND PLAN:   1.  Dyspnea on exertion: Unclear etiology.  She is a non-smoker.  ECG is unrevealing.  She does have orthopnea and if she did indeed have COVID, it may have led to a cardiomyopathy. I will order a 2-D echocardiogram with Doppler to evaluate cardiac structure, function, and regional wall motion. I will proceed with a nuclear myocardial perfusion imaging study to evaluate for ischemic heart disease (Lexiscan Myoview).  2.  Hypertension: Blood pressure is normal.  No changes to therapy.    Disposition: Follow up in 2-3 months  Signed: Kate Sable, M.D., F.A.C.C.  01/17/2019, 2:41 PM

## 2019-01-17 NOTE — Patient Instructions (Signed)
Medication Instructions:  Continue all current medications.  Labwork: none  Testing/Procedures:  Your physician has requested that you have an echocardiogram. Echocardiography is a painless test that uses sound waves to create images of your heart. It provides your doctor with information about the size and shape of your heart and how well your heart's chambers and valves are working. This procedure takes approximately one hour. There are no restrictions for this procedure.  Your physician has requested that you have a lexiscan myoview. For further information please visit [email or web address]. Please follow instruction sheet, as given.  Office will contact with results via phone or letter.    Follow-Up: 2-3 months   Any Other Special Instructions Will Be Listed Below (If Applicable).  If you need a refill on your cardiac medications before your next appointment, please call your pharmacy.

## 2019-01-21 ENCOUNTER — Encounter: Payer: Self-pay | Admitting: *Deleted

## 2019-01-23 ENCOUNTER — Ambulatory Visit (INDEPENDENT_AMBULATORY_CARE_PROVIDER_SITE_OTHER): Payer: Medicare HMO

## 2019-01-23 ENCOUNTER — Other Ambulatory Visit: Payer: Self-pay

## 2019-01-23 ENCOUNTER — Encounter (HOSPITAL_COMMUNITY)
Admission: RE | Admit: 2019-01-23 | Discharge: 2019-01-23 | Disposition: A | Discharge status: Home / Self Care | Payer: Medicare HMO | Source: Ambulatory Visit | Attending: Cardiovascular Disease | Admitting: Cardiovascular Disease

## 2019-01-23 DIAGNOSIS — R0609 Other forms of dyspnea: Secondary | ICD-10-CM

## 2019-01-23 DIAGNOSIS — R0600 Dyspnea, unspecified: Secondary | ICD-10-CM

## 2019-01-24 ENCOUNTER — Telehealth: Payer: Self-pay | Admitting: *Deleted

## 2019-01-24 NOTE — Telephone Encounter (Signed)
Notes recorded by Laurine Blazer, LPN on 01/19/4234 at 36:14 AM EDT  Patient notified. Copy to pmd.  ------   Notes recorded by Herminio Commons, MD on 01/23/2019 at 4:50 PM EDT  Normal cardiac function.

## 2019-01-25 ENCOUNTER — Ambulatory Visit: Payer: Medicare HMO | Admitting: Cardiology

## 2019-02-01 ENCOUNTER — Encounter (HOSPITAL_COMMUNITY)
Admission: RE | Admit: 2019-02-01 | Discharge: 2019-02-01 | Disposition: A | Discharge status: Home / Self Care | Payer: Medicare HMO | Source: Ambulatory Visit | Attending: Cardiovascular Disease | Admitting: Cardiovascular Disease

## 2019-02-01 ENCOUNTER — Encounter (HOSPITAL_BASED_OUTPATIENT_CLINIC_OR_DEPARTMENT_OTHER)
Admission: RE | Admit: 2019-02-01 | Discharge: 2019-02-01 | Disposition: A | Discharge status: Home / Self Care | Payer: Medicare HMO | Source: Ambulatory Visit | Attending: Cardiovascular Disease | Admitting: Cardiovascular Disease

## 2019-02-01 ENCOUNTER — Other Ambulatory Visit: Payer: Self-pay

## 2019-02-01 ENCOUNTER — Telehealth: Payer: Self-pay | Admitting: *Deleted

## 2019-02-01 ENCOUNTER — Encounter (HOSPITAL_COMMUNITY): Payer: Self-pay

## 2019-02-01 DIAGNOSIS — R0609 Other forms of dyspnea: Secondary | ICD-10-CM | POA: Diagnosis not present

## 2019-02-01 HISTORY — DX: Unspecified asthma, uncomplicated: J45.909

## 2019-02-01 LAB — NM MYOCAR MULTI W/SPECT W/WALL MOTION / EF
LV dias vol: 49 mL (ref 46–106)
LV sys vol: 10 mL
Peak HR: 98 {beats}/min
RATE: 0.54
Rest HR: 76 {beats}/min
SDS: 1
SRS: 0
SSS: 1
TID: 1

## 2019-02-01 MED ORDER — TECHNETIUM TC 99M TETROFOSMIN IV KIT
10.0000 | PACK | Freq: Once | INTRAVENOUS | Status: AC | PRN
  Administered 2019-02-01: 11 via INTRAVENOUS

## 2019-02-01 MED ORDER — SODIUM CHLORIDE 0.9% FLUSH
INTRAVENOUS | Status: AC
  Administered 2019-02-01: 10 mL via INTRAVENOUS
  Filled 2019-02-01: qty 10

## 2019-02-01 MED ORDER — REGADENOSON 0.4 MG/5ML IV SOLN
INTRAVENOUS | Status: AC
  Administered 2019-02-01: 0.08 mg via INTRAVENOUS
  Filled 2019-02-01: qty 5

## 2019-02-01 MED ORDER — TECHNETIUM TC 99M TETROFOSMIN IV KIT
30.0000 | PACK | Freq: Once | INTRAVENOUS | Status: AC | PRN
  Administered 2019-02-01: 32 via INTRAVENOUS

## 2019-02-01 NOTE — Telephone Encounter (Signed)
Notes recorded by Laurine Blazer, LPN on 06/20/7508 at 2:58 PM EDT  Patient notified. Copy to pmd.  ------   Notes recorded by Herminio Commons, MD on 02/01/2019 at 11:45 AM EDT  Normal. No evidence for blockages.

## 2019-03-12 DIAGNOSIS — Z6831 Body mass index (BMI) 31.0-31.9, adult: Secondary | ICD-10-CM | POA: Diagnosis not present

## 2019-03-12 DIAGNOSIS — G4700 Insomnia, unspecified: Secondary | ICD-10-CM | POA: Diagnosis not present

## 2019-03-12 DIAGNOSIS — T50905A Adverse effect of unspecified drugs, medicaments and biological substances, initial encounter: Secondary | ICD-10-CM | POA: Diagnosis not present

## 2019-03-12 DIAGNOSIS — I10 Essential (primary) hypertension: Secondary | ICD-10-CM | POA: Diagnosis not present

## 2019-03-21 ENCOUNTER — Ambulatory Visit: Payer: Medicare HMO | Admitting: Cardiovascular Disease

## 2019-05-10 DIAGNOSIS — R69 Illness, unspecified: Secondary | ICD-10-CM | POA: Diagnosis not present

## 2019-05-10 DIAGNOSIS — Z1389 Encounter for screening for other disorder: Secondary | ICD-10-CM | POA: Diagnosis not present

## 2019-05-10 DIAGNOSIS — K219 Gastro-esophageal reflux disease without esophagitis: Secondary | ICD-10-CM | POA: Diagnosis not present

## 2019-05-10 DIAGNOSIS — Z6831 Body mass index (BMI) 31.0-31.9, adult: Secondary | ICD-10-CM | POA: Diagnosis not present

## 2019-05-10 DIAGNOSIS — Z0000 Encounter for general adult medical examination without abnormal findings: Secondary | ICD-10-CM | POA: Diagnosis not present

## 2019-05-10 DIAGNOSIS — E063 Autoimmune thyroiditis: Secondary | ICD-10-CM | POA: Diagnosis not present

## 2019-06-07 IMAGING — US US ABDOMEN COMPLETE
1 series · 14 of 25 positions shown · non-contrast
Comparison: 10/31/2017.

CLINICAL DATA: Abdominal pain.

EXAM:
ABDOMEN ULTRASOUND COMPLETE

[Series 1: us abdomen complete · 14 of 109 slices shown]
[im 1/109]
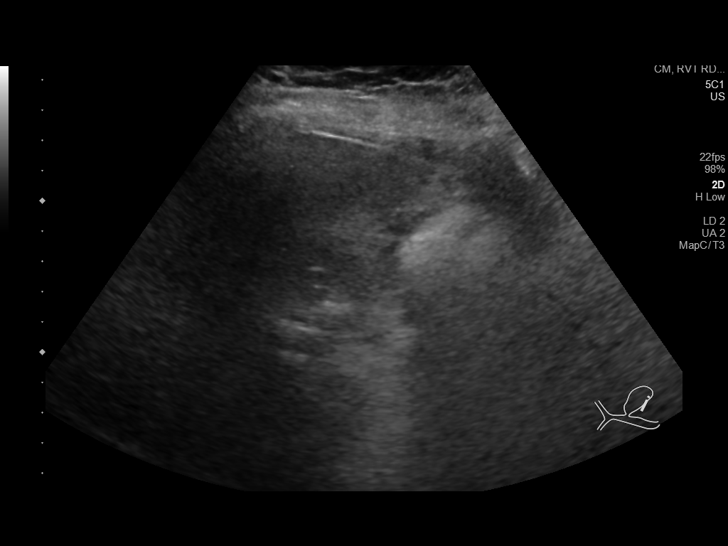
[im 10/109]
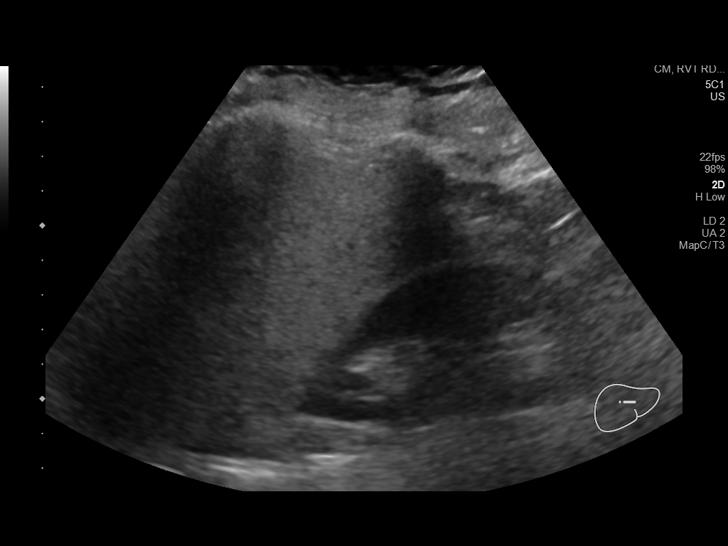
[im 19/109]
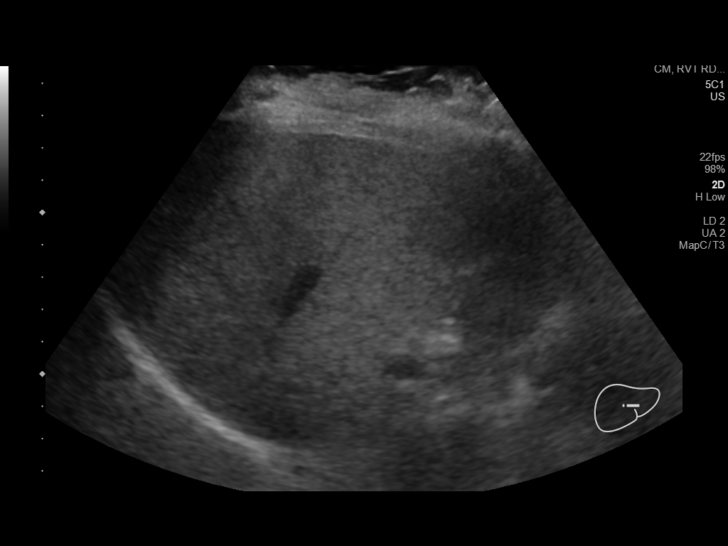
[im 28/109]
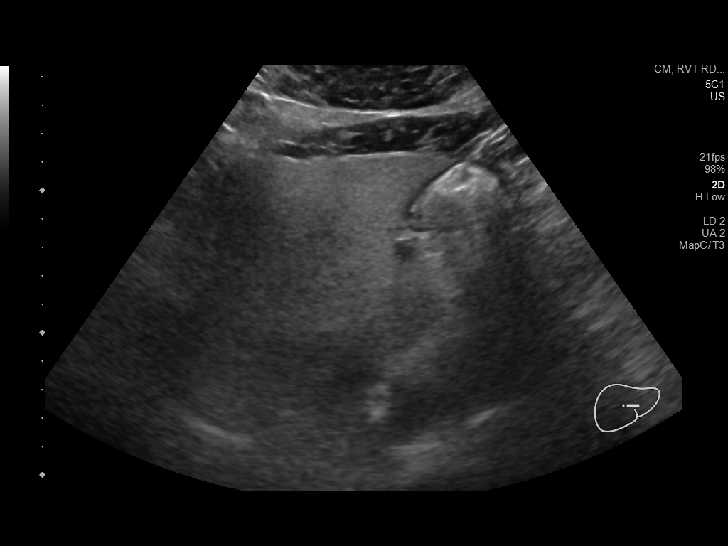
[im 37/109]
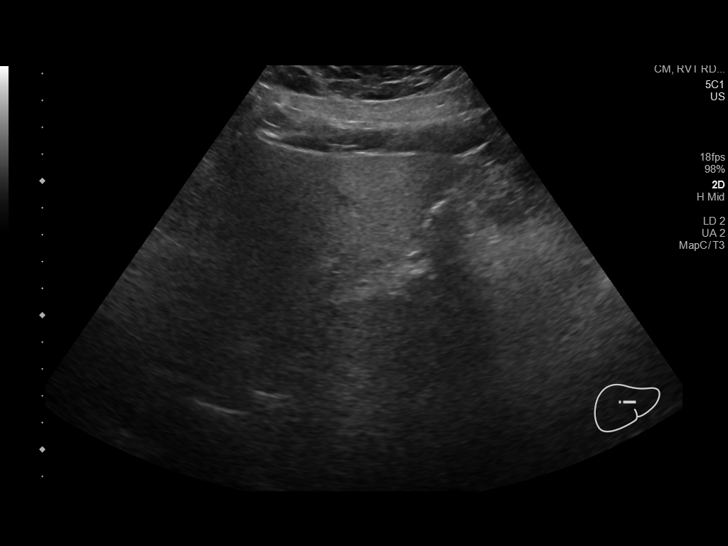
[im 41/109]
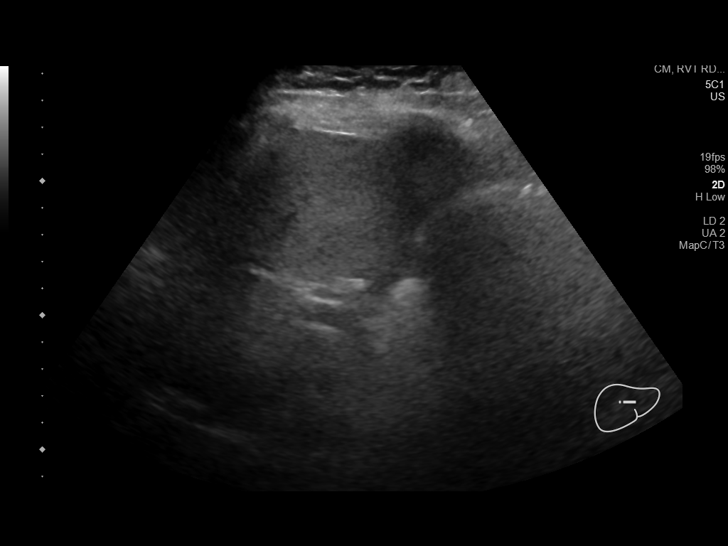
[im 50/109]
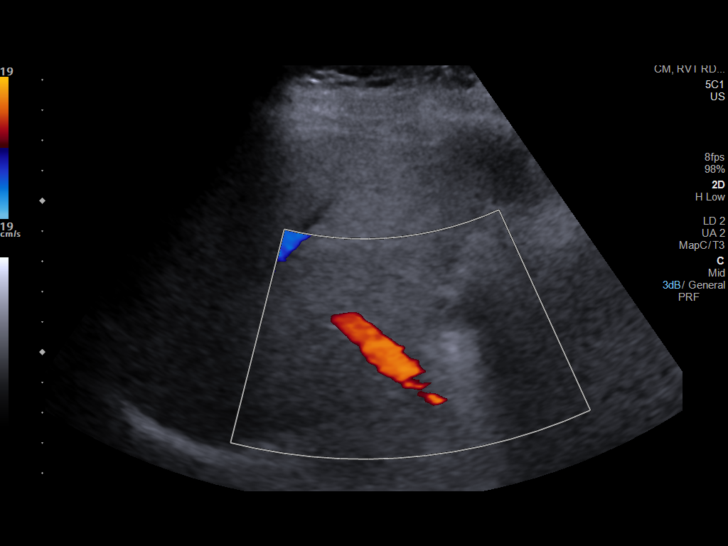
[im 59/109]
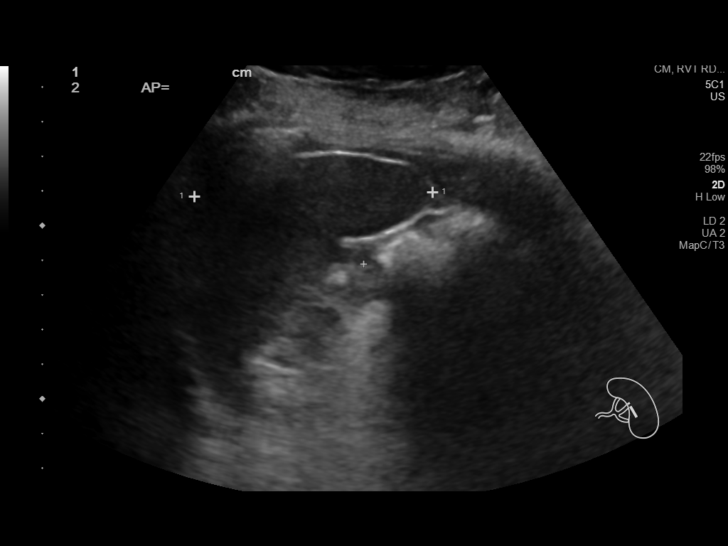
[im 68/109]
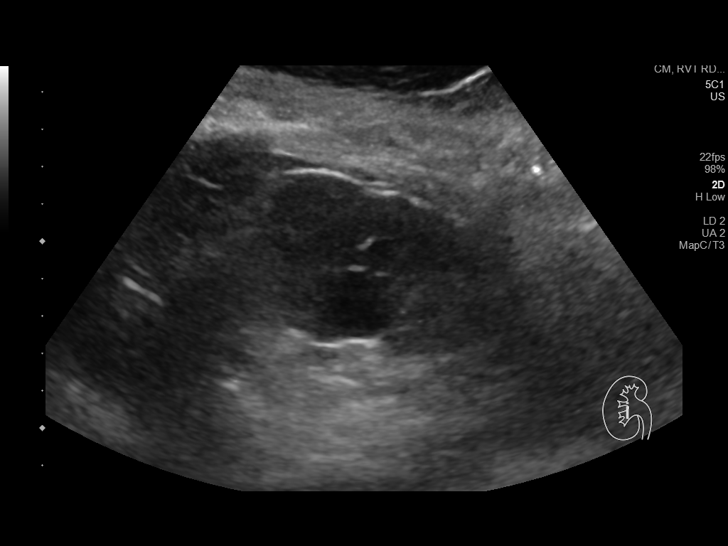
[im 73/109]
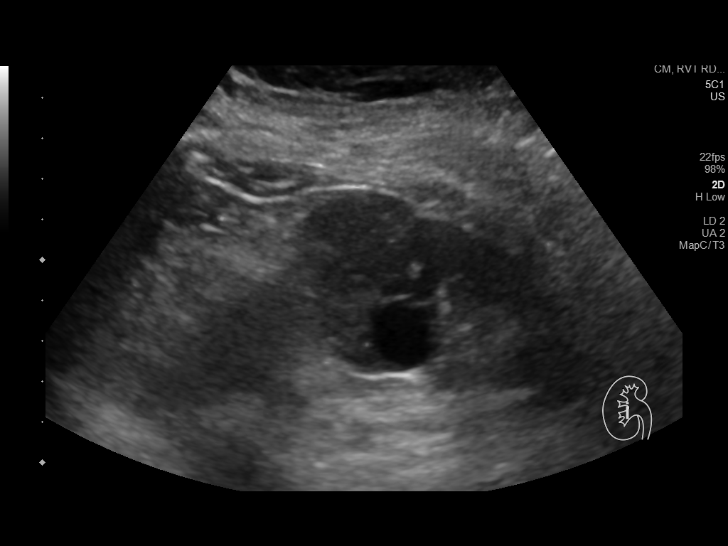
[im 82/109]
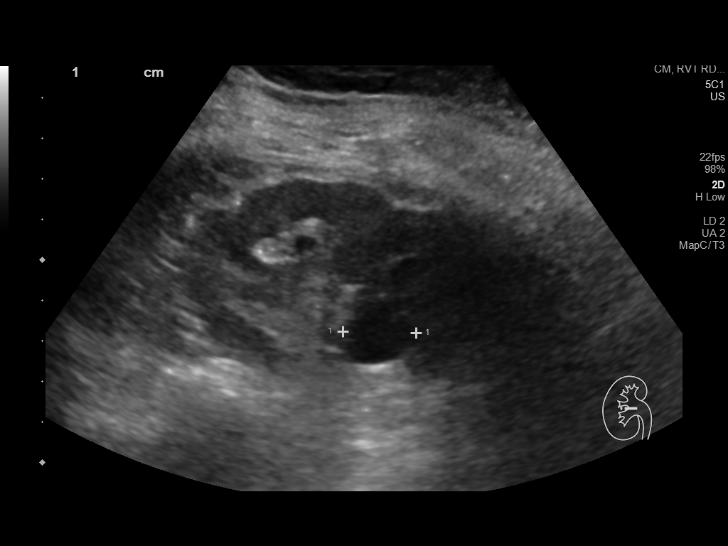
[im 91/109]
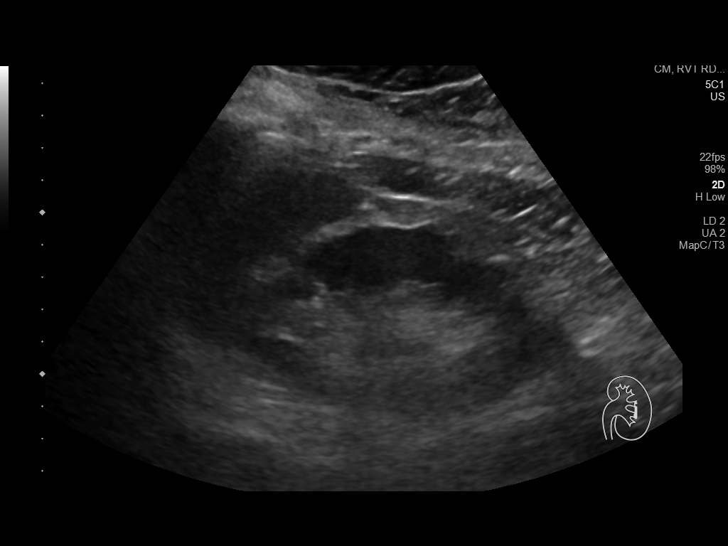
[im 100/109]
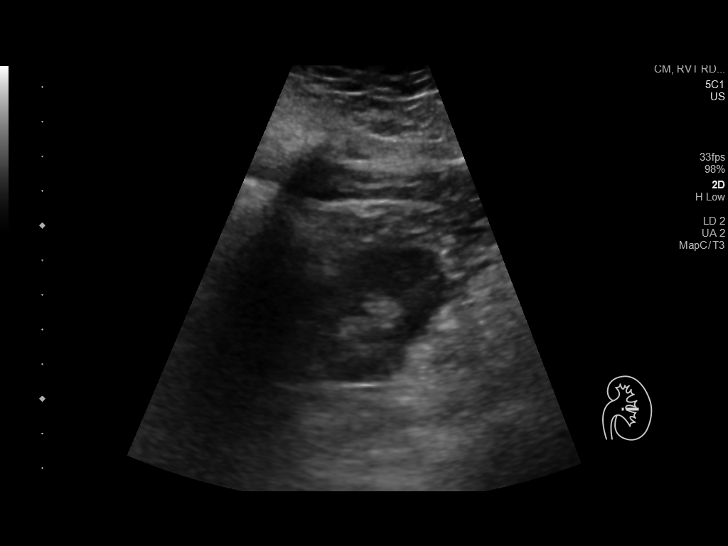
[im 109/109]
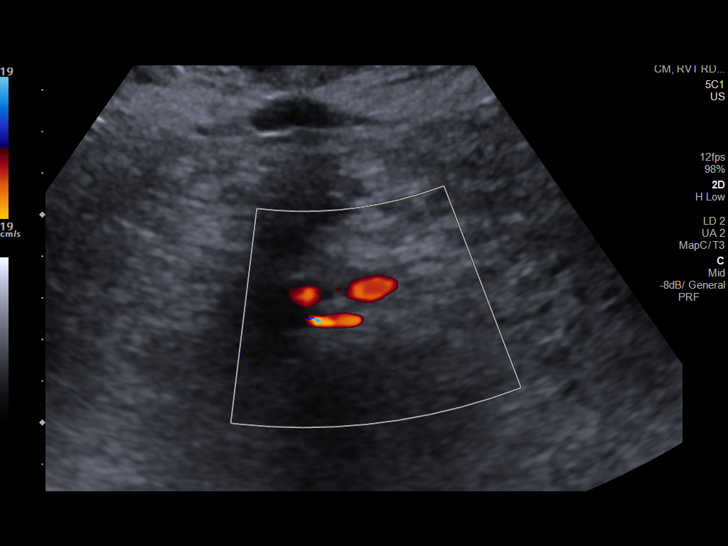

[14 of 25 positions shown; findings below may reference images not displayed]

FINDINGS: Gallbladder: Cholecystectomy.

Common bile duct: Diameter: 5.5 mm

Liver: Increased echogenicity consistent fatty infiltration or
hepatocellular disease. Portal vein is patent on color Doppler
imaging with normal direction of blood flow towards the liver.

IVC: No abnormality visualized.

Pancreas: Visualized portion unremarkable.

Spleen: Size and appearance within normal limits.

Right Kidney: Length: 9.5 cm. Echogenicity within normal limits.
and 1.8 cm simple cysts. No hydronephrosis visualized.

Left Kidney: Length: 10.5 cm. Echogenicity within normal limits. No
mass or hydronephrosis visualized.

Abdominal aorta: No aneurysm visualized.

Other findings: None.
IMPRESSION: 1.  Cholecystectomy.  No biliary distention.

2. Increased hepatic echogenicity consistent fatty infiltration or
hepatocellular disease. 3. 2.7 and 1.8 cm simple cyst right kidney.

## 2019-08-09 DIAGNOSIS — Z1231 Encounter for screening mammogram for malignant neoplasm of breast: Secondary | ICD-10-CM | POA: Diagnosis not present

## 2019-08-27 DIAGNOSIS — R69 Illness, unspecified: Secondary | ICD-10-CM | POA: Diagnosis not present

## 2019-09-10 DIAGNOSIS — Z6832 Body mass index (BMI) 32.0-32.9, adult: Secondary | ICD-10-CM | POA: Diagnosis not present

## 2019-09-10 DIAGNOSIS — K219 Gastro-esophageal reflux disease without esophagitis: Secondary | ICD-10-CM | POA: Diagnosis not present

## 2019-09-10 DIAGNOSIS — R69 Illness, unspecified: Secondary | ICD-10-CM | POA: Diagnosis not present

## 2019-09-10 DIAGNOSIS — I10 Essential (primary) hypertension: Secondary | ICD-10-CM | POA: Diagnosis not present

## 2019-09-10 DIAGNOSIS — E7849 Other hyperlipidemia: Secondary | ICD-10-CM | POA: Diagnosis not present

## 2019-09-11 DIAGNOSIS — R252 Cramp and spasm: Secondary | ICD-10-CM | POA: Diagnosis not present

## 2019-09-11 DIAGNOSIS — Z1389 Encounter for screening for other disorder: Secondary | ICD-10-CM | POA: Diagnosis not present

## 2019-09-11 DIAGNOSIS — Z79899 Other long term (current) drug therapy: Secondary | ICD-10-CM | POA: Diagnosis not present

## 2019-10-03 ENCOUNTER — Institutional Professional Consult (permissible substitution): Payer: Medicare HMO | Admitting: Internal Medicine

## 2019-10-15 DIAGNOSIS — R69 Illness, unspecified: Secondary | ICD-10-CM | POA: Diagnosis not present

## 2019-10-17 ENCOUNTER — Institutional Professional Consult (permissible substitution): Payer: Medicare HMO | Admitting: Pulmonary Disease

## 2019-10-19 IMAGING — DX CHEST - 2 VIEW
2 series · 2 of 2 positions shown · non-contrast
Comparison: 11/29/2011

CLINICAL DATA: Cough.  Shortness of breath since [DATE].

EXAM:
CHEST - 2 VIEW

[chest pa]
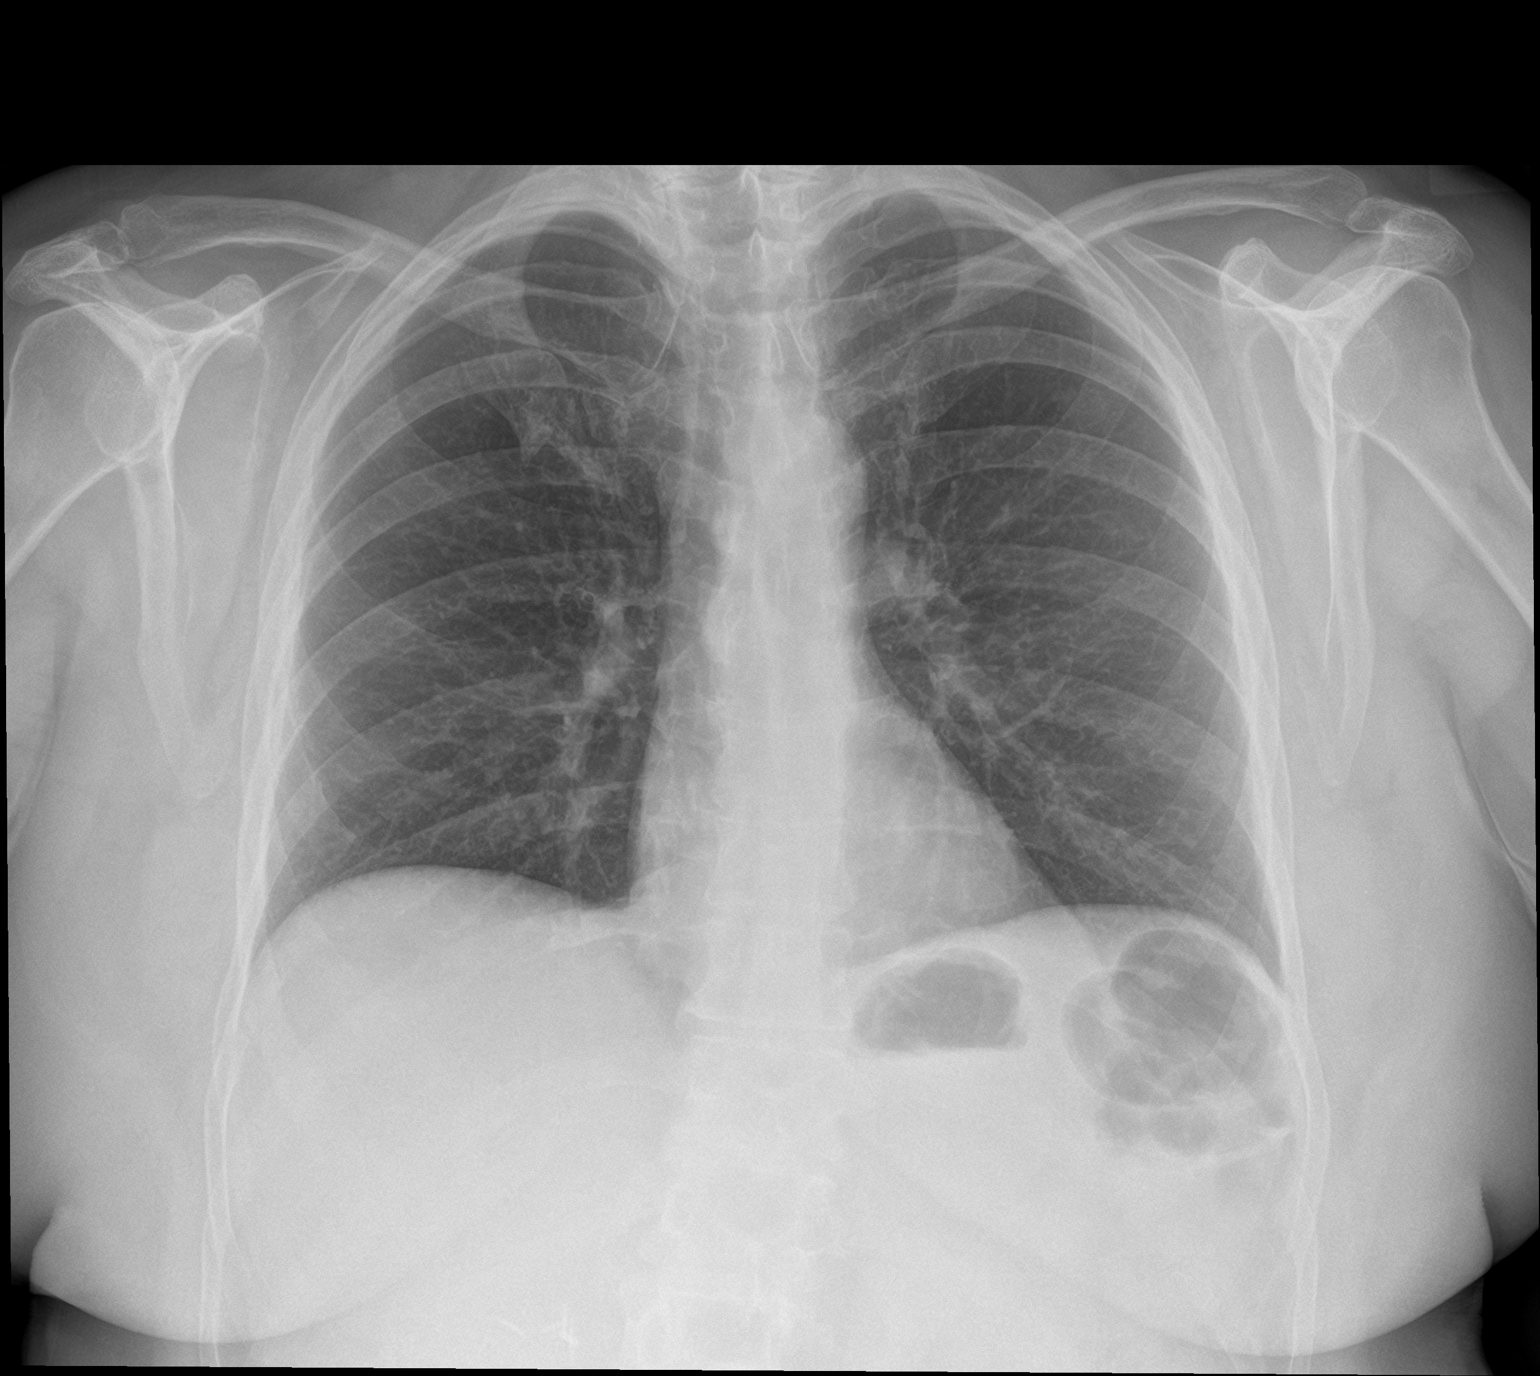

[chest lat]
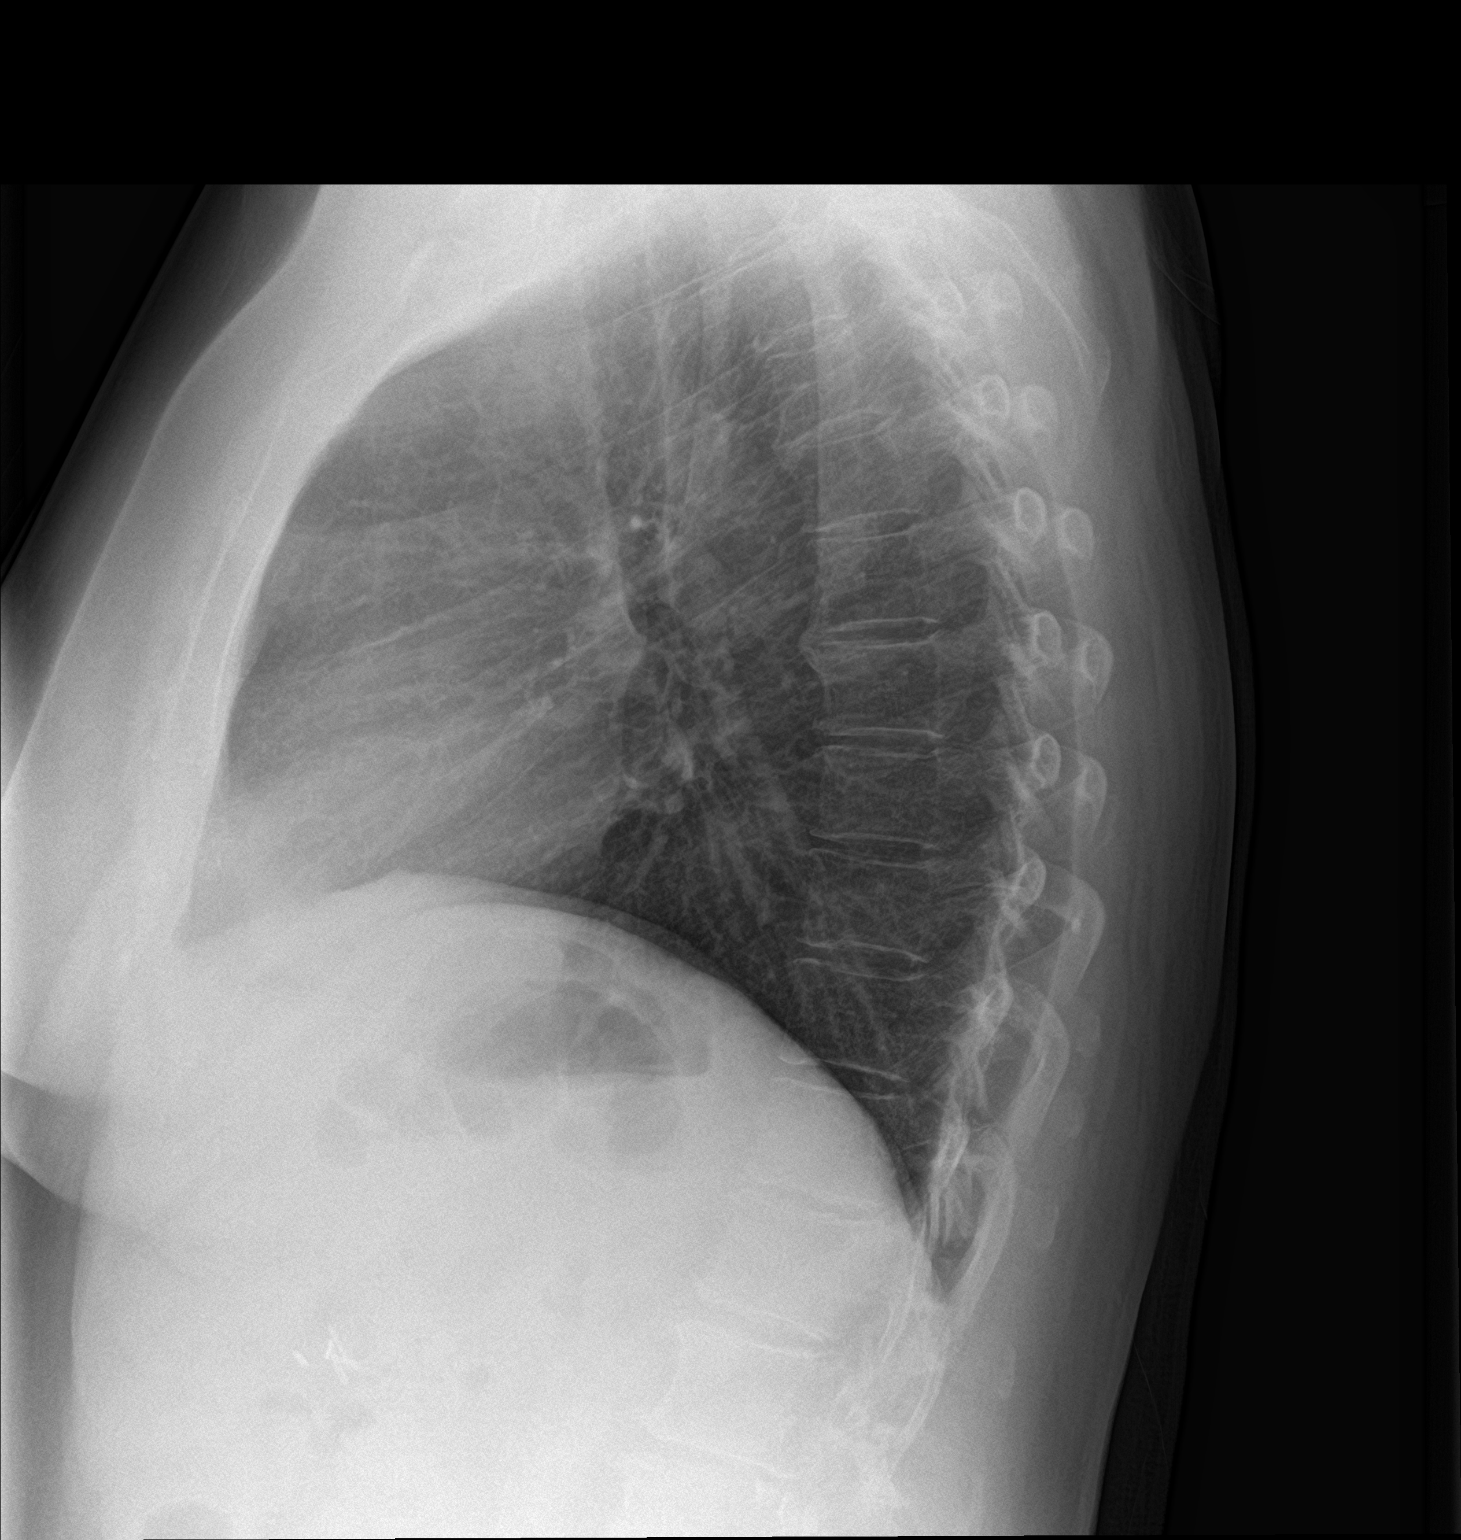

[2 of 2 positions shown; findings below may reference images not displayed]

FINDINGS: The cardiomediastinal silhouette is unchanged with normal heart
size. The lungs are mildly hypoinflated. No airspace consolidation,
edema, pleural effusion, or pneumothorax is identified. Right upper
abdominal surgical clips are noted. No acute osseous abnormality is
seen.
IMPRESSION: No active cardiopulmonary disease.

## 2020-01-15 DIAGNOSIS — U071 COVID-19: Secondary | ICD-10-CM | POA: Diagnosis not present

## 2020-01-15 DIAGNOSIS — Z681 Body mass index (BMI) 19 or less, adult: Secondary | ICD-10-CM | POA: Diagnosis not present

## 2020-01-15 DIAGNOSIS — R05 Cough: Secondary | ICD-10-CM | POA: Diagnosis not present

## 2020-01-15 DIAGNOSIS — R69 Illness, unspecified: Secondary | ICD-10-CM | POA: Diagnosis not present

## 2020-03-25 ENCOUNTER — Encounter: Payer: Self-pay | Admitting: Internal Medicine

## 2020-05-19 DIAGNOSIS — Z6832 Body mass index (BMI) 32.0-32.9, adult: Secondary | ICD-10-CM | POA: Diagnosis not present

## 2020-05-19 DIAGNOSIS — I10 Essential (primary) hypertension: Secondary | ICD-10-CM | POA: Diagnosis not present

## 2020-05-19 DIAGNOSIS — Z1389 Encounter for screening for other disorder: Secondary | ICD-10-CM | POA: Diagnosis not present

## 2020-05-19 DIAGNOSIS — Z1331 Encounter for screening for depression: Secondary | ICD-10-CM | POA: Diagnosis not present

## 2020-05-19 DIAGNOSIS — Z0001 Encounter for general adult medical examination with abnormal findings: Secondary | ICD-10-CM | POA: Diagnosis not present

## 2020-05-19 DIAGNOSIS — E039 Hypothyroidism, unspecified: Secondary | ICD-10-CM | POA: Diagnosis not present

## 2020-05-19 DIAGNOSIS — E669 Obesity, unspecified: Secondary | ICD-10-CM | POA: Diagnosis not present

## 2020-05-19 DIAGNOSIS — E7849 Other hyperlipidemia: Secondary | ICD-10-CM | POA: Diagnosis not present

## 2020-05-19 DIAGNOSIS — E782 Mixed hyperlipidemia: Secondary | ICD-10-CM | POA: Diagnosis not present

## 2020-05-19 DIAGNOSIS — E6609 Other obesity due to excess calories: Secondary | ICD-10-CM | POA: Diagnosis not present

## 2020-06-09 DIAGNOSIS — R69 Illness, unspecified: Secondary | ICD-10-CM | POA: Diagnosis not present

## 2020-09-04 DIAGNOSIS — Z6831 Body mass index (BMI) 31.0-31.9, adult: Secondary | ICD-10-CM | POA: Diagnosis not present

## 2020-09-04 DIAGNOSIS — I10 Essential (primary) hypertension: Secondary | ICD-10-CM | POA: Diagnosis not present

## 2020-09-04 DIAGNOSIS — R69 Illness, unspecified: Secondary | ICD-10-CM | POA: Diagnosis not present

## 2020-09-04 DIAGNOSIS — Z1331 Encounter for screening for depression: Secondary | ICD-10-CM | POA: Diagnosis not present

## 2020-09-04 DIAGNOSIS — E063 Autoimmune thyroiditis: Secondary | ICD-10-CM | POA: Diagnosis not present

## 2020-09-09 ENCOUNTER — Encounter (INDEPENDENT_AMBULATORY_CARE_PROVIDER_SITE_OTHER): Payer: Self-pay | Admitting: *Deleted

## 2020-10-02 DIAGNOSIS — Z1231 Encounter for screening mammogram for malignant neoplasm of breast: Secondary | ICD-10-CM | POA: Diagnosis not present

## 2020-11-04 ENCOUNTER — Telehealth (INDEPENDENT_AMBULATORY_CARE_PROVIDER_SITE_OTHER): Payer: Self-pay

## 2020-11-04 ENCOUNTER — Encounter (INDEPENDENT_AMBULATORY_CARE_PROVIDER_SITE_OTHER): Payer: Self-pay

## 2020-11-04 ENCOUNTER — Encounter (INDEPENDENT_AMBULATORY_CARE_PROVIDER_SITE_OTHER): Payer: Self-pay | Admitting: *Deleted

## 2020-11-04 ENCOUNTER — Other Ambulatory Visit (INDEPENDENT_AMBULATORY_CARE_PROVIDER_SITE_OTHER): Payer: Self-pay

## 2020-11-04 DIAGNOSIS — Z1211 Encounter for screening for malignant neoplasm of colon: Secondary | ICD-10-CM

## 2020-11-04 MED ORDER — PEG 3350-KCL-NA BICARB-NACL 420 G PO SOLR: 4000.0000 mL | ORAL | 0 refills | Status: DC

## 2020-11-04 NOTE — Telephone Encounter (Signed)
LeighAnn Kaydra Borgen, CMA  

## 2020-11-04 NOTE — Telephone Encounter (Signed)
Referring MD/PCP: Gerarda Fraction  Procedure: Tcs  Reason/Indication:  Screening  Has patient had this procedure before?  yes  If so, when, by whom and where?  2011  Is there a family history of colon cancer?  no  Who?  What age when diagnosed?    Is patient diabetic? If yes, Type 1 or Type 2   no      Does patient have prosthetic heart valve or mechanical valve?  no  Do you have a pacemaker/defibrillator?  no  Has patient ever had endocarditis/atrial fibrillation? no  Have you had a stroke/heart attack last 6 mths? no  Does patient use oxygen? no  Has patient had joint replacement within last 12 months?  no  Is patient constipated or do they take laxatives? no  Does patient have a history of alcohol/drug use?  no  Is patient on blood thinner such as Coumadin, Plavix and/or Aspirin? no  Do you take medicine for weight loss?  no  For female patients,: do you still have your menstrual cycle? no  Medications: Tizanidine 4 mg prn, Atorvastatin 10 mg daily, Losartan 50 mg daily, Levothyroxine 75 mcg daily, Alprazolam 1 mg bid, Mirtazapine 45 mg nightly, Albuterol Inhaler prn  Allergies: Codeine  Medication Adjustment per Dr Rehman/Dr Jenetta Downer none  Procedure date & time: 12/02/20 7:30

## 2020-11-05 NOTE — Telephone Encounter (Signed)
Ok to schedule.  Thanks,  Shatera Rennert Castaneda Mayorga, MD Gastroenterology and Hepatology [facility]  

## 2020-11-18 ENCOUNTER — Other Ambulatory Visit (INDEPENDENT_AMBULATORY_CARE_PROVIDER_SITE_OTHER): Payer: Self-pay

## 2020-11-18 DIAGNOSIS — Z01812 Encounter for preprocedural laboratory examination: Secondary | ICD-10-CM

## 2020-11-18 NOTE — Progress Notes (Unsigned)
bm 

## 2020-11-24 DIAGNOSIS — H8113 Benign paroxysmal vertigo, bilateral: Secondary | ICD-10-CM | POA: Diagnosis not present

## 2020-11-24 DIAGNOSIS — I10 Essential (primary) hypertension: Secondary | ICD-10-CM | POA: Diagnosis not present

## 2020-11-24 DIAGNOSIS — Z6832 Body mass index (BMI) 32.0-32.9, adult: Secondary | ICD-10-CM | POA: Diagnosis not present

## 2020-11-24 DIAGNOSIS — J309 Allergic rhinitis, unspecified: Secondary | ICD-10-CM | POA: Diagnosis not present

## 2020-11-24 DIAGNOSIS — Z23 Encounter for immunization: Secondary | ICD-10-CM | POA: Diagnosis not present

## 2020-11-24 DIAGNOSIS — E063 Autoimmune thyroiditis: Secondary | ICD-10-CM | POA: Diagnosis not present

## 2020-11-24 DIAGNOSIS — J329 Chronic sinusitis, unspecified: Secondary | ICD-10-CM | POA: Diagnosis not present

## 2020-11-30 ENCOUNTER — Other Ambulatory Visit (HOSPITAL_COMMUNITY)
Admission: RE | Admit: 2020-11-30 | Discharge: 2020-11-30 | Disposition: A | Discharge status: Home / Self Care | Payer: Medicare HMO | Source: Ambulatory Visit | Attending: Gastroenterology | Admitting: Gastroenterology

## 2020-11-30 ENCOUNTER — Other Ambulatory Visit: Payer: Self-pay

## 2020-11-30 DIAGNOSIS — Z01812 Encounter for preprocedural laboratory examination: Secondary | ICD-10-CM | POA: Insufficient documentation

## 2020-11-30 LAB — BASIC METABOLIC PANEL
Anion gap: 7 (ref 5–15)
BUN: 19 mg/dL (ref 8–23)
CO2: 30 mmol/L (ref 22–32)
Calcium: 10 mg/dL (ref 8.9–10.3)
Chloride: 98 mmol/L (ref 98–111)
Creatinine, Ser: 1.07 mg/dL — ABNORMAL HIGH (ref 0.44–1.00)
GFR, Estimated: 58 mL/min — ABNORMAL LOW (ref >60)
Glucose, Bld: 91 mg/dL (ref 70–99)
Potassium: 3.7 mmol/L (ref 3.5–5.1)
Sodium: 135 mmol/L (ref 135–145)

## 2020-12-01 ENCOUNTER — Other Ambulatory Visit (HOSPITAL_COMMUNITY): Payer: Medicare HMO

## 2020-12-02 ENCOUNTER — Ambulatory Visit (HOSPITAL_COMMUNITY): Payer: Medicare HMO | Admitting: Anesthesiology

## 2020-12-02 ENCOUNTER — Ambulatory Visit (HOSPITAL_COMMUNITY)
Admission: RE | Admit: 2020-12-02 | Discharge: 2020-12-02 | Disposition: A | Discharge status: Home / Self Care | Payer: Medicare HMO | Attending: Gastroenterology | Admitting: Gastroenterology

## 2020-12-02 ENCOUNTER — Encounter (HOSPITAL_COMMUNITY): Payer: Self-pay | Admitting: Gastroenterology

## 2020-12-02 ENCOUNTER — Encounter (HOSPITAL_COMMUNITY)
Admission: RE | Disposition: A | Discharge status: Home / Self Care | Payer: Self-pay | Source: Home / Self Care | Attending: Gastroenterology

## 2020-12-02 ENCOUNTER — Other Ambulatory Visit: Payer: Self-pay

## 2020-12-02 DIAGNOSIS — F419 Anxiety disorder, unspecified: Secondary | ICD-10-CM | POA: Insufficient documentation

## 2020-12-02 DIAGNOSIS — E039 Hypothyroidism, unspecified: Secondary | ICD-10-CM | POA: Insufficient documentation

## 2020-12-02 DIAGNOSIS — Z79899 Other long term (current) drug therapy: Secondary | ICD-10-CM | POA: Insufficient documentation

## 2020-12-02 DIAGNOSIS — Z888 Allergy status to other drugs, medicaments and biological substances status: Secondary | ICD-10-CM | POA: Insufficient documentation

## 2020-12-02 DIAGNOSIS — D123 Benign neoplasm of transverse colon: Secondary | ICD-10-CM

## 2020-12-02 DIAGNOSIS — R69 Illness, unspecified: Secondary | ICD-10-CM | POA: Diagnosis not present

## 2020-12-02 DIAGNOSIS — I10 Essential (primary) hypertension: Secondary | ICD-10-CM | POA: Insufficient documentation

## 2020-12-02 DIAGNOSIS — J45909 Unspecified asthma, uncomplicated: Secondary | ICD-10-CM | POA: Insufficient documentation

## 2020-12-02 DIAGNOSIS — Z1211 Encounter for screening for malignant neoplasm of colon: Secondary | ICD-10-CM

## 2020-12-02 DIAGNOSIS — Z79890 Hormone replacement therapy (postmenopausal): Secondary | ICD-10-CM | POA: Insufficient documentation

## 2020-12-02 DIAGNOSIS — Z885 Allergy status to narcotic agent status: Secondary | ICD-10-CM | POA: Insufficient documentation

## 2020-12-02 DIAGNOSIS — K635 Polyp of colon: Secondary | ICD-10-CM | POA: Diagnosis not present

## 2020-12-02 HISTORY — PX: COLONOSCOPY WITH PROPOFOL: SHX5780

## 2020-12-02 HISTORY — PX: POLYPECTOMY: SHX5525

## 2020-12-02 LAB — HM COLONOSCOPY

## 2020-12-02 SURGERY — COLONOSCOPY WITH PROPOFOL
Anesthesia: General

## 2020-12-02 MED ORDER — PROPOFOL 10 MG/ML IV BOLUS
INTRAVENOUS | Status: DC | PRN
  Administered 2020-12-02: 20 mg via INTRAVENOUS
  Administered 2020-12-02: 30 mg via INTRAVENOUS
  Administered 2020-12-02: 50 mg via INTRAVENOUS
  Administered 2020-12-02 (×2): 30 mg via INTRAVENOUS
  Administered 2020-12-02: 20 mg via INTRAVENOUS
  Administered 2020-12-02: 70 mg via INTRAVENOUS

## 2020-12-02 MED ORDER — PROPOFOL 1000 MG/100ML IV EMUL
INTRAVENOUS | Status: AC
  Filled 2020-12-02: qty 300

## 2020-12-02 MED ORDER — PROPOFOL 10 MG/ML IV BOLUS
INTRAVENOUS | Status: AC
  Filled 2020-12-02: qty 100

## 2020-12-02 MED ORDER — LACTATED RINGERS IV SOLN: INTRAVENOUS | Status: DC

## 2020-12-02 MED ORDER — STERILE WATER FOR IRRIGATION IR SOLN
Status: DC | PRN
  Administered 2020-12-02: 100 mL

## 2020-12-02 NOTE — Anesthesia Preprocedure Evaluation (Signed)
Anesthesia Evaluation  Patient identified by MRN, date of birth, ID band Patient awake    Reviewed: Allergy & Precautions, H&P , NPO status , Patient's Chart, lab work & pertinent test results, reviewed documented beta blocker date and time   Airway Mallampati: II  TM Distance: >3 FB Neck ROM: full    Dental no notable dental hx.    Pulmonary asthma ,    Pulmonary exam normal breath sounds clear to auscultation       Cardiovascular Exercise Tolerance: Good hypertension, negative cardio ROS   Rhythm:regular Rate:Normal     Neuro/Psych PSYCHIATRIC DISORDERS Anxiety Depression negative neurological ROS  negative psych ROS   GI/Hepatic Neg liver ROS, GERD  Medicated,  Endo/Other  negative endocrine ROSHypothyroidism   Renal/GU negative Renal ROS  negative genitourinary   Musculoskeletal negative musculoskeletal ROS (+)   Abdominal   Peds negative pediatric ROS (+)  Hematology negative hematology ROS (+)   Anesthesia Other Findings   Reproductive/Obstetrics negative OB ROS                             Anesthesia Physical Anesthesia Plan  ASA: 2  Anesthesia Plan: General   Post-op Pain Management:    Induction:   PONV Risk Score and Plan: Propofol infusion  Airway Management Planned:   Additional Equipment:   Intra-op Plan:   Post-operative Plan:   Informed Consent: I have reviewed the patients History and Physical, chart, labs and discussed the procedure including the risks, benefits and alternatives for the proposed anesthesia with the patient or authorized representative who has indicated his/her understanding and acceptance.     Dental Advisory Given  Plan Discussed with: CRNA  Anesthesia Plan Comments:         Anesthesia Quick Evaluation

## 2020-12-02 NOTE — Transfer of Care (Signed)
Immediate Anesthesia Transfer of Care Note  Patient: Rebekah Gonzalez  Procedure(s) Performed: COLONOSCOPY WITH PROPOFOL POLYPECTOMY  Patient Location: Endoscopy Unit  Anesthesia Type:General  Level of Consciousness: awake  Airway & Oxygen Therapy: Patient Spontanous Breathing  Post-op Assessment: Report given to RN  Post vital signs: Reviewed and stable  Last Vitals:  Vitals Value Taken Time  BP    Temp    Pulse    Resp    SpO2      Last Pain:  Vitals:   12/02/20 0741  TempSrc:   PainSc: 0-No pain      Patients Stated Pain Goal: 8 (21/62/44 6950)  Complications: No notable events documented.

## 2020-12-02 NOTE — Op Note (Signed)
[facility] Patient Name: Rebekah Gonzalez Procedure Date: 12/02/2020 7:01 AM MRN: [id number] Date of Birth: 06-06-1956 Attending MD: Maylon Peppers ,  CSN: [id number] Age: 65 Admit Type: Outpatient Procedure:                Colonoscopy Indications:              Screening for colorectal malignant neoplasm Providers:                Maylon Peppers, Rosina Lowenstein, RN, Aram Candela Referring MD:              Medicines:                Monitored Anesthesia Care Complications:            No immediate complications. Estimated Blood Loss:     Estimated blood loss: none. Procedure:                Pre-Anesthesia Assessment:                           - Prior to the procedure, a History and Physical                            was performed, and patient medications, allergies                            and sensitivities were reviewed. The patient's                            tolerance of previous anesthesia was reviewed.                           - The risks and benefits of the procedure and the                            sedation options and risks were discussed with the                            patient. All questions were answered and informed                            consent was obtained.                           After obtaining informed consent, the colonoscope                            was passed under direct vision. Throughout the                            procedure, the patient's blood pressure, pulse, and                            oxygen saturations were monitored continuously. The                            [id number]-[id number] ([id number]) scope  was introduced through                            the anus and advanced to the the cecum, identified                            by appendiceal orifice and ileocecal valve. The                            colonoscopy was performed without difficulty. The                            patient tolerated the procedure well. The quality                             of the bowel preparation was adequate to identify                            polyps 6 mm and larger in size. Scope In: 7:45:33 AM Scope Out: 8:07:01 AM Scope Withdrawal Time: 0 hours 13 minutes 23 seconds  Total Procedure Duration: 0 hours 21 minutes 28 seconds  Findings:      The perianal and digital rectal examinations were normal.      A 5 mm polyp was found in the transverse colon. The polyp was sessile.       The polyp was removed with a cold snare. Resection and retrieval were       complete.      The retroflexed view of the distal rectum and anal verge was normal and       showed no anal or rectal abnormalities. Impression:               - One 5 mm polyp in the transverse colon, removed                            with a cold snare. Resected and retrieved.                           - The distal rectum and anal verge are normal on                            retroflexion view. Moderate Sedation:      Per Anesthesia Care Recommendation:           - Discharge patient to home (ambulatory).                           - Resume previous diet.                           - Await pathology results.                           - Repeat colonoscopy for surveillance based on                            pathology results. Procedure  Code(s):        --- Professional ---                           [phone], Colonoscopy, flexible; with removal of                            tumor(s), polyp(s), or other lesion(s) by snare                            technique Diagnosis Code(s):        --- Professional ---                           Z12.11, Encounter for screening for malignant                            neoplasm of colon                           K63.5, Polyp of colon CPT copyright 2019 American [employer] Association. All rights reserved. The codes documented in this report are preliminary and upon coder review may  be revised to meet current compliance requirements. Maylon Peppers, MD Maylon Peppers,  12/02/2020 8:10:53 AM This report has been signed electronically. Number of Addenda: 0

## 2020-12-02 NOTE — H&P (Signed)
Rebekah Gonzalez is an 65 y.o. female.   Chief Complaint: screening colonoscopy HPI: 65 y/o F with PMH asthma , anxiety, hypothyroidism, HTN, coming for screening colonoscopy. The patient has never had a colonoscopy in the past.  The patient denies having any complaints such as melena, hematochezia, abdominal pain or distention, change in her bowel movement consistency or frequency, no changes in her weight recently.  No family history of colorectal cancer.   Past Medical History:  Diagnosis Date   Anxiety    Asthma    Depression    Hypertension    Hypothyroidism     Past Surgical History:  Procedure Laterality Date   APPENDECTOMY     CHOLECYSTECTOMY N/A 11/26/2014   Procedure: LAPAROSCOPIC CHOLECYSTECTOMY;  Surgeon: Aviva Signs Md, MD;  Location: [facility];  Service: General;  Laterality: N/A;   TUBAL LIGATION      Family History  Problem Relation Age of Onset   Throat cancer Mother    Stroke Father    Heart attack Father    Hypertension Father    Hypertension Brother    Heart attack Maternal Grandfather    Stroke Paternal Grandmother    Social History:  reports that she has never smoked. She has never used smokeless tobacco. She reports current alcohol use of about 1.0 standard drink of alcohol per week. She reports that she does not use drugs.  Allergies:  Allergies  Allergen Reactions   Citalopram Hydrobromide Other (See Comments)    Sick feeling   Codeine     REACTION: itch/rash (tolerates hydrocodone)   Eszopiclone Other (See Comments)    Lunesta Hyper   Paroxetine Other (See Comments)    unknown   Sertraline Hcl Other (See Comments)    Make hyper    Medications Prior to Admission  Medication Sig Dispense Refill   albuterol (VENTOLIN HFA) 108 (90 Base) MCG/ACT inhaler Inhale 1-2 puffs into the lungs every 4 (four) hours as needed.     ALPRAZolam (XANAX) 1 MG tablet Take 1 mg by mouth 3 (three) times daily.  2   atorvastatin (LIPITOR) 10 MG tablet Take 1 tablet  by mouth daily.     levothyroxine (SYNTHROID, LEVOTHROID) 75 MCG tablet Take 75 mcg by mouth daily.  11   losartan (COZAAR) 50 MG tablet Take 50 mg by mouth daily.     mirtazapine (REMERON) 45 MG tablet Take 45 mg by mouth at bedtime.     tiZANidine (ZANAFLEX) 4 MG tablet Take 4 mg by mouth 4 (four) times daily as needed.     furosemide (LASIX) 20 MG tablet Take 20 mg by mouth in the morning, at noon, in the evening, and at bedtime.     ibuprofen (ADVIL,MOTRIN) 800 MG tablet Take 800 mg by mouth every 8 (eight) hours as needed for mild pain or moderate pain.     montelukast (SINGULAIR) 10 MG tablet Take 10 mg by mouth daily.     polyethylene glycol-electrolytes (TRILYTE) 420 g solution Take 4,000 mLs by mouth as directed. 4000 mL 0    Results for orders placed or performed during the hospital encounter of 11/30/20 (from the past 48 hour(s))  Basic metabolic panel     Status: Abnormal   Collection Time: 11/30/20 10:16 AM  Result Value Ref Range   Sodium 135 135 - 145 mmol/L   Potassium 3.7 3.5 - 5.1 mmol/L   Chloride 98 98 - 111 mmol/L   CO2 30 22 - 32 mmol/L  Glucose, Bld 91 70 - 99 mg/dL    Comment: Glucose reference range applies only to samples taken after fasting for at least 8 hours.   BUN 19 8 - 23 mg/dL   Creatinine, Ser 1.07 (H) 0.44 - 1.00 mg/dL   Calcium 10.0 8.9 - 10.3 mg/dL   GFR, Estimated 58 (L) >60 mL/min    Comment: (NOTE) Calculated using the CKD-EPI Creatinine Equation (2021)    Anion gap 7 5 - 15    Comment: Performed at [facility], [address], [place], [place] [address]   No results found.  Review of Systems  Constitutional: Negative.   HENT: Negative.    Eyes: Negative.   Respiratory: Negative.    Cardiovascular: Negative.   Gastrointestinal: Negative.   Endocrine: Negative.   Genitourinary: Negative.   Musculoskeletal: Negative.   Skin: Negative.   Allergic/Immunologic: Negative.   Neurological: Negative.   Hematological: Negative.    Psychiatric/Behavioral: Negative.     Blood pressure 123/67, pulse 100, temperature 99.3 F (37.4 C), temperature source Oral, resp. rate 19, height 5\' 3"  (1.6 m), weight 80.3 kg, SpO2 97 %. Physical Exam  GENERAL: The patient is AO x3, in no acute distress. HEENT: Head is normocephalic and atraumatic. EOMI are intact. Mouth is well hydrated and without lesions. NECK: Supple. No masses LUNGS: Clear to auscultation. No presence of rhonchi/wheezing/rales. Adequate chest expansion HEART: RRR, normal s1 and s2. ABDOMEN: Soft, nontender, no guarding, no peritoneal signs, and nondistended. BS +. No masses. EXTREMITIES: Without any cyanosis, clubbing, rash, lesions or edema. NEUROLOGIC: AOx3, no focal motor deficit. SKIN: no jaundice, no rashes  Assessment/Plan 65 y/o F with PMH asthma , anxiety, hypothyroidism, HTN, coming for screening colonoscopy. The patient has never had a colonoscopy in the past.  The patient is at average risk for colorectal cancer.  We will proceed with colonoscopy today.   Harvel Quale, MD 12/02/2020, 7:33 AM

## 2020-12-02 NOTE — Anesthesia Postprocedure Evaluation (Signed)
Anesthesia Post Note  Patient: Rebekah Gonzalez  Procedure(s) Performed: COLONOSCOPY WITH PROPOFOL POLYPECTOMY  Patient location during evaluation: Endoscopy Anesthesia Type: General Level of consciousness: awake and alert Pain management: pain level controlled Vital Signs Assessment: post-procedure vital signs reviewed and stable Respiratory status: spontaneous breathing Cardiovascular status: blood pressure returned to baseline Postop Assessment: no apparent nausea or vomiting Anesthetic complications: no   No notable events documented.   Last Vitals:  Vitals:   12/02/20 0650  BP: 123/67  Pulse: 100  Resp: 19  Temp: 37.4 C  SpO2: 97%    Last Pain:  Vitals:   12/02/20 0741  TempSrc:   PainSc: 0-No pain                 [name]

## 2020-12-02 NOTE — Discharge Instructions (Addendum)
You are being discharged to home.  Resume your previous diet.  We are waiting for your pathology results.  Your physician has recommended a repeat colonoscopy for surveillance based on pathology results.  

## 2020-12-03 LAB — SURGICAL PATHOLOGY

## 2020-12-09 ENCOUNTER — Encounter (INDEPENDENT_AMBULATORY_CARE_PROVIDER_SITE_OTHER): Payer: Self-pay | Admitting: *Deleted

## 2020-12-09 ENCOUNTER — Encounter (HOSPITAL_COMMUNITY): Payer: Self-pay | Admitting: Gastroenterology

## 2021-02-25 DIAGNOSIS — Z23 Encounter for immunization: Secondary | ICD-10-CM | POA: Diagnosis not present

## 2021-02-25 DIAGNOSIS — E063 Autoimmune thyroiditis: Secondary | ICD-10-CM | POA: Diagnosis not present

## 2021-02-25 DIAGNOSIS — E6609 Other obesity due to excess calories: Secondary | ICD-10-CM | POA: Diagnosis not present

## 2021-02-25 DIAGNOSIS — I10 Essential (primary) hypertension: Secondary | ICD-10-CM | POA: Diagnosis not present

## 2021-02-25 DIAGNOSIS — R69 Illness, unspecified: Secondary | ICD-10-CM | POA: Diagnosis not present

## 2021-02-25 DIAGNOSIS — F419 Anxiety disorder, unspecified: Secondary | ICD-10-CM | POA: Diagnosis not present

## 2021-02-25 DIAGNOSIS — Z6832 Body mass index (BMI) 32.0-32.9, adult: Secondary | ICD-10-CM | POA: Diagnosis not present

## 2021-02-25 DIAGNOSIS — G4700 Insomnia, unspecified: Secondary | ICD-10-CM | POA: Diagnosis not present

## 2021-02-25 DIAGNOSIS — E039 Hypothyroidism, unspecified: Secondary | ICD-10-CM | POA: Diagnosis not present

## 2021-03-04 DIAGNOSIS — U071 COVID-19: Secondary | ICD-10-CM | POA: Diagnosis not present

## 2021-04-02 DIAGNOSIS — W57XXXA Bitten or stung by nonvenomous insect and other nonvenomous arthropods, initial encounter: Secondary | ICD-10-CM | POA: Diagnosis not present

## 2021-04-02 DIAGNOSIS — R079 Chest pain, unspecified: Secondary | ICD-10-CM | POA: Diagnosis not present

## 2021-04-02 DIAGNOSIS — Z20822 Contact with and (suspected) exposure to covid-19: Secondary | ICD-10-CM | POA: Diagnosis not present

## 2021-04-02 DIAGNOSIS — Z743 Need for continuous supervision: Secondary | ICD-10-CM | POA: Diagnosis not present

## 2021-04-02 DIAGNOSIS — I959 Hypotension, unspecified: Secondary | ICD-10-CM | POA: Diagnosis not present

## 2021-04-02 DIAGNOSIS — T63461A Toxic effect of venom of wasps, accidental (unintentional), initial encounter: Secondary | ICD-10-CM | POA: Diagnosis not present

## 2021-04-02 DIAGNOSIS — R4020 Unspecified coma: Secondary | ICD-10-CM | POA: Diagnosis not present

## 2021-04-02 DIAGNOSIS — Z79899 Other long term (current) drug therapy: Secondary | ICD-10-CM | POA: Diagnosis not present

## 2021-04-02 DIAGNOSIS — U071 COVID-19: Secondary | ICD-10-CM | POA: Diagnosis not present

## 2021-04-02 DIAGNOSIS — E876 Hypokalemia: Secondary | ICD-10-CM | POA: Diagnosis not present

## 2021-04-02 DIAGNOSIS — R55 Syncope and collapse: Secondary | ICD-10-CM | POA: Diagnosis not present

## 2021-04-02 DIAGNOSIS — Z885 Allergy status to narcotic agent status: Secondary | ICD-10-CM | POA: Diagnosis not present

## 2021-04-02 DIAGNOSIS — R0902 Hypoxemia: Secondary | ICD-10-CM | POA: Diagnosis not present

## 2021-05-12 DIAGNOSIS — F419 Anxiety disorder, unspecified: Secondary | ICD-10-CM | POA: Diagnosis not present

## 2021-05-12 DIAGNOSIS — I10 Essential (primary) hypertension: Secondary | ICD-10-CM | POA: Diagnosis not present

## 2021-05-12 DIAGNOSIS — R69 Illness, unspecified: Secondary | ICD-10-CM | POA: Diagnosis not present

## 2021-05-12 DIAGNOSIS — Z6832 Body mass index (BMI) 32.0-32.9, adult: Secondary | ICD-10-CM | POA: Diagnosis not present

## 2021-05-12 DIAGNOSIS — E6609 Other obesity due to excess calories: Secondary | ICD-10-CM | POA: Diagnosis not present

## 2021-07-16 DIAGNOSIS — E063 Autoimmune thyroiditis: Secondary | ICD-10-CM | POA: Diagnosis not present

## 2021-07-16 DIAGNOSIS — E6609 Other obesity due to excess calories: Secondary | ICD-10-CM | POA: Diagnosis not present

## 2021-07-16 DIAGNOSIS — F419 Anxiety disorder, unspecified: Secondary | ICD-10-CM | POA: Diagnosis not present

## 2021-07-16 DIAGNOSIS — Z0001 Encounter for general adult medical examination with abnormal findings: Secondary | ICD-10-CM | POA: Diagnosis not present

## 2021-07-16 DIAGNOSIS — R69 Illness, unspecified: Secondary | ICD-10-CM | POA: Diagnosis not present

## 2021-07-16 DIAGNOSIS — Z1331 Encounter for screening for depression: Secondary | ICD-10-CM | POA: Diagnosis not present

## 2021-07-16 DIAGNOSIS — I10 Essential (primary) hypertension: Secondary | ICD-10-CM | POA: Diagnosis not present

## 2021-07-16 DIAGNOSIS — B372 Candidiasis of skin and nail: Secondary | ICD-10-CM | POA: Diagnosis not present

## 2021-07-16 DIAGNOSIS — Z6831 Body mass index (BMI) 31.0-31.9, adult: Secondary | ICD-10-CM | POA: Diagnosis not present

## 2021-09-17 DIAGNOSIS — E6609 Other obesity due to excess calories: Secondary | ICD-10-CM | POA: Diagnosis not present

## 2021-09-17 DIAGNOSIS — J45909 Unspecified asthma, uncomplicated: Secondary | ICD-10-CM | POA: Diagnosis not present

## 2021-09-17 DIAGNOSIS — I10 Essential (primary) hypertension: Secondary | ICD-10-CM | POA: Diagnosis not present

## 2021-09-17 DIAGNOSIS — R69 Illness, unspecified: Secondary | ICD-10-CM | POA: Diagnosis not present

## 2021-09-17 DIAGNOSIS — E063 Autoimmune thyroiditis: Secondary | ICD-10-CM | POA: Diagnosis not present

## 2021-09-17 DIAGNOSIS — Z6830 Body mass index (BMI) 30.0-30.9, adult: Secondary | ICD-10-CM | POA: Diagnosis not present

## 2021-09-17 DIAGNOSIS — J329 Chronic sinusitis, unspecified: Secondary | ICD-10-CM | POA: Diagnosis not present

## 2021-09-30 ENCOUNTER — Other Ambulatory Visit (HOSPITAL_COMMUNITY): Payer: Self-pay | Admitting: Family Medicine

## 2021-09-30 DIAGNOSIS — E6609 Other obesity due to excess calories: Secondary | ICD-10-CM | POA: Diagnosis not present

## 2021-09-30 DIAGNOSIS — N950 Postmenopausal bleeding: Secondary | ICD-10-CM | POA: Diagnosis not present

## 2021-09-30 DIAGNOSIS — Z6831 Body mass index (BMI) 31.0-31.9, adult: Secondary | ICD-10-CM | POA: Diagnosis not present

## 2021-10-08 ENCOUNTER — Ambulatory Visit (HOSPITAL_COMMUNITY)
Admission: RE | Admit: 2021-10-08 | Discharge: 2021-10-08 | Disposition: A | Discharge status: Home / Self Care | Payer: Medicare HMO | Source: Ambulatory Visit | Attending: Family Medicine | Admitting: Family Medicine

## 2021-10-08 DIAGNOSIS — N950 Postmenopausal bleeding: Secondary | ICD-10-CM | POA: Diagnosis not present

## 2021-10-28 DIAGNOSIS — I10 Essential (primary) hypertension: Secondary | ICD-10-CM | POA: Diagnosis not present

## 2021-10-28 DIAGNOSIS — H5210 Myopia, unspecified eye: Secondary | ICD-10-CM | POA: Diagnosis not present

## 2021-10-28 DIAGNOSIS — Z0100 Encounter for examination of eyes and vision without abnormal findings: Secondary | ICD-10-CM | POA: Diagnosis not present

## 2021-10-28 DIAGNOSIS — E7800 Pure hypercholesterolemia, unspecified: Secondary | ICD-10-CM | POA: Diagnosis not present

## 2022-01-01 DIAGNOSIS — Z1231 Encounter for screening mammogram for malignant neoplasm of breast: Secondary | ICD-10-CM | POA: Diagnosis not present

## 2022-02-24 DIAGNOSIS — U071 COVID-19: Secondary | ICD-10-CM | POA: Diagnosis not present

## 2022-02-24 DIAGNOSIS — I10 Essential (primary) hypertension: Secondary | ICD-10-CM | POA: Diagnosis not present

## 2022-03-03 DIAGNOSIS — H2513 Age-related nuclear cataract, bilateral: Secondary | ICD-10-CM | POA: Diagnosis not present

## 2022-03-03 DIAGNOSIS — H353131 Nonexudative age-related macular degeneration, bilateral, early dry stage: Secondary | ICD-10-CM | POA: Diagnosis not present

## 2022-03-03 DIAGNOSIS — H01001 Unspecified blepharitis right upper eyelid: Secondary | ICD-10-CM | POA: Diagnosis not present

## 2022-03-03 DIAGNOSIS — H01002 Unspecified blepharitis right lower eyelid: Secondary | ICD-10-CM | POA: Diagnosis not present

## 2022-03-25 ENCOUNTER — Ambulatory Visit (HOSPITAL_COMMUNITY)
Admission: RE | Admit: 2022-03-25 | Discharge: 2022-03-25 | Disposition: A | Discharge status: Home / Self Care | Payer: Medicare HMO | Source: Ambulatory Visit | Attending: Internal Medicine | Admitting: Internal Medicine

## 2022-03-25 ENCOUNTER — Encounter (HOSPITAL_COMMUNITY): Payer: Self-pay

## 2022-03-25 ENCOUNTER — Other Ambulatory Visit: Payer: Self-pay

## 2022-03-25 ENCOUNTER — Emergency Department (HOSPITAL_COMMUNITY): Payer: Medicare HMO

## 2022-03-25 ENCOUNTER — Emergency Department (HOSPITAL_COMMUNITY)
Admission: EM | Admit: 2022-03-25 | Discharge: 2022-03-25 | Disposition: A | Discharge status: Home / Self Care | Payer: Medicare HMO | Attending: Emergency Medicine | Admitting: Emergency Medicine

## 2022-03-25 ENCOUNTER — Other Ambulatory Visit (HOSPITAL_COMMUNITY): Payer: Self-pay | Admitting: Internal Medicine

## 2022-03-25 DIAGNOSIS — R0781 Pleurodynia: Secondary | ICD-10-CM | POA: Diagnosis not present

## 2022-03-25 DIAGNOSIS — R059 Cough, unspecified: Secondary | ICD-10-CM | POA: Diagnosis not present

## 2022-03-25 DIAGNOSIS — R55 Syncope and collapse: Secondary | ICD-10-CM | POA: Diagnosis not present

## 2022-03-25 DIAGNOSIS — R0602 Shortness of breath: Secondary | ICD-10-CM | POA: Diagnosis not present

## 2022-03-25 DIAGNOSIS — Z79899 Other long term (current) drug therapy: Secondary | ICD-10-CM | POA: Diagnosis not present

## 2022-03-25 DIAGNOSIS — R052 Subacute cough: Secondary | ICD-10-CM | POA: Diagnosis not present

## 2022-03-25 DIAGNOSIS — R69 Illness, unspecified: Secondary | ICD-10-CM | POA: Diagnosis not present

## 2022-03-25 DIAGNOSIS — E6609 Other obesity due to excess calories: Secondary | ICD-10-CM | POA: Diagnosis not present

## 2022-03-25 DIAGNOSIS — R0789 Other chest pain: Secondary | ICD-10-CM | POA: Diagnosis not present

## 2022-03-25 DIAGNOSIS — R051 Acute cough: Secondary | ICD-10-CM | POA: Insufficient documentation

## 2022-03-25 DIAGNOSIS — H81399 Other peripheral vertigo, unspecified ear: Secondary | ICD-10-CM | POA: Diagnosis not present

## 2022-03-25 DIAGNOSIS — K219 Gastro-esophageal reflux disease without esophagitis: Secondary | ICD-10-CM | POA: Diagnosis not present

## 2022-03-25 DIAGNOSIS — I10 Essential (primary) hypertension: Secondary | ICD-10-CM | POA: Diagnosis not present

## 2022-03-25 DIAGNOSIS — E063 Autoimmune thyroiditis: Secondary | ICD-10-CM | POA: Diagnosis not present

## 2022-03-25 DIAGNOSIS — N644 Mastodynia: Secondary | ICD-10-CM | POA: Diagnosis not present

## 2022-03-25 DIAGNOSIS — Z6832 Body mass index (BMI) 32.0-32.9, adult: Secondary | ICD-10-CM | POA: Diagnosis not present

## 2022-03-25 DIAGNOSIS — J45909 Unspecified asthma, uncomplicated: Secondary | ICD-10-CM | POA: Diagnosis not present

## 2022-03-25 DIAGNOSIS — U099 Post covid-19 condition, unspecified: Secondary | ICD-10-CM | POA: Diagnosis not present

## 2022-03-25 LAB — CBC
HCT: 37.8 % (ref 36.0–46.0)
Hemoglobin: 12.2 g/dL (ref 12.0–15.0)
MCH: 30.9 pg (ref 26.0–34.0)
MCHC: 32.3 g/dL (ref 30.0–36.0)
MCV: 95.7 fL (ref 80.0–100.0)
Platelets: 178 10*3/uL (ref 150–400)
RBC: 3.95 MIL/uL (ref 3.87–5.11)
RDW: 13.7 % (ref 11.5–15.5)
WBC: 6.1 10*3/uL (ref 4.0–10.5)
nRBC: 0 % (ref 0.0–0.2)

## 2022-03-25 LAB — BASIC METABOLIC PANEL
Anion gap: 7 (ref 5–15)
BUN: 16 mg/dL (ref 8–23)
CO2: 25 mmol/L (ref 22–32)
Calcium: 9.3 mg/dL (ref 8.9–10.3)
Chloride: 107 mmol/L (ref 98–111)
Creatinine, Ser: 1.09 mg/dL — ABNORMAL HIGH (ref 0.44–1.00)
GFR, Estimated: 56 mL/min — ABNORMAL LOW (ref >60)
Glucose, Bld: 115 mg/dL — ABNORMAL HIGH (ref 70–99)
Potassium: 4.1 mmol/L (ref 3.5–5.1)
Sodium: 139 mmol/L (ref 135–145)

## 2022-03-25 LAB — CBG MONITORING, ED: Glucose-Capillary: 111 mg/dL — ABNORMAL HIGH (ref 70–99)

## 2022-03-25 MED ORDER — BENZONATATE 100 MG PO CAPS: 100.0000 mg | ORAL_CAPSULE | Freq: Three times a day (TID) | ORAL | 0 refills | Status: AC

## 2022-03-25 MED ORDER — BENZONATATE 100 MG PO CAPS
200.0000 mg | ORAL_CAPSULE | Freq: Once | ORAL | Status: AC
  Administered 2022-03-25: 200 mg via ORAL
  Filled 2022-03-25: qty 2

## 2022-03-25 MED ORDER — PREDNISONE 20 MG PO TABS
40.0000 mg | ORAL_TABLET | Freq: Once | ORAL | Status: AC
  Administered 2022-03-25: 40 mg via ORAL
  Filled 2022-03-25: qty 2

## 2022-03-25 NOTE — ED Provider Notes (Addendum)
[facility] EMERGENCY DEPARTMENT Provider Note   CSN: [id number] Arrival date & time: 03/25/22  1527     History  Chief Complaint  Patient presents with   Near Syncope    Rebekah Gonzalez is a 66 y.o. female.   Near Syncope   This patient is a 66 year old female known to have asthma, she has a history of developing COVID-19 about a month ago, she has been under the care of her family doctor Dr. Gerarda Fraction for the last month after having COVID and has been using multiple different medications including albuterol metered-dose inhalers, albuterol nebulizers and even had a course of prednisone early on but continues to cough in fact she states the cough is getting a little worse.  She denies fevers or chills no nausea or vomiting, she was actually sent to the hospital from the office today because of pain in the left anterior ribs underneath the left breast where she has felt acute onset of pain with coughing.  This is worse with taking a deep breath, it is not associated with any swelling of the legs and she does not have any fevers or chills  After the patient had had x-rays done she was walking down the hallway and had a near syncopal event in the hallway.  A rapid response was called, when I evaluated the patient she was awake and alert on the floor feeling lightheaded, she was not having palpitations, her heart rate was normal, her lung function was normal, she was having no headache, no blurred vision and had an unremarkable exam at that time.  I asked and she requested to come over to the emergency department for formal evaluation    Home Medications Prior to Admission medications   Medication Sig Start Date End Date Taking? Authorizing Provider  benzonatate (TESSALON) 100 MG capsule Take 1 capsule (100 mg total) by mouth every 8 (eight) hours. 03/25/22  Yes Noemi Chapel, MD  albuterol (VENTOLIN HFA) 108 (90 Base) MCG/ACT inhaler Inhale 1-2 puffs into the lungs every 4 (four) hours as  needed. 12/18/18   [provider]  ALPRAZolam Duanne Moron) 1 MG tablet Take 1 mg by mouth 3 (three) times daily. 10/14/14   [provider]  atorvastatin (LIPITOR) 10 MG tablet Take 1 tablet by mouth daily. 01/09/19   [provider]  furosemide (LASIX) 20 MG tablet Take 20 mg by mouth in the morning, at noon, in the evening, and at bedtime.    [provider]  ibuprofen (ADVIL,MOTRIN) 800 MG tablet Take 800 mg by mouth every 8 (eight) hours as needed for mild pain or moderate pain.    [provider]  levothyroxine (SYNTHROID, LEVOTHROID) 75 MCG tablet Take 75 mcg by mouth daily. 10/28/14   [provider]  losartan (COZAAR) 50 MG tablet Take 50 mg by mouth daily. 01/09/19   [provider]  mirtazapine (REMERON) 45 MG tablet Take 45 mg by mouth at bedtime.    [provider]  montelukast (SINGULAIR) 10 MG tablet Take 10 mg by mouth daily. 11/24/20   [provider]  tiZANidine (ZANAFLEX) 4 MG tablet Take 4 mg by mouth 4 (four) times daily as needed. 11/04/20   [provider]      Allergies    Citalopram hydrobromide, Codeine, Eszopiclone, Paroxetine, and Sertraline hcl    Review of Systems   Review of Systems  Cardiovascular:  Positive for near-syncope.  All other systems reviewed and are negative.   Physical Exam  Updated Vital Signs BP (!) 148/81 (BP Location: Right Arm)   Pulse 88   Temp 99.4 F (37.4 C) (Oral)   Resp 20   Ht 1.6 m ('5\' 3"'$ )   Wt 83 kg   SpO2 94%   BMI 32.42 kg/m  Physical Exam Vitals and nursing note reviewed.  Constitutional:      General: She is not in acute distress.    Appearance: She is well-developed.  HENT:     Head: Normocephalic and atraumatic.     Mouth/Throat:     Pharynx: No oropharyngeal exudate.  Eyes:     General: No scleral icterus.       Right eye: No discharge.        Left eye: No discharge.     Conjunctiva/sclera: Conjunctivae normal.     Pupils: Pupils  are equal, round, and reactive to light.  Neck:     Thyroid: No thyromegaly.     Vascular: No JVD.  Cardiovascular:     Rate and Rhythm: Normal rate and regular rhythm.     Heart sounds: Normal heart sounds. No murmur heard.    No friction rub. No gallop.  Pulmonary:     Effort: Pulmonary effort is normal. No respiratory distress.     Breath sounds: Normal breath sounds. No wheezing or rales.  Chest:     Chest wall: Tenderness present.  Abdominal:     General: Bowel sounds are normal. There is no distension.     Palpations: Abdomen is soft. There is no mass.     Tenderness: There is no abdominal tenderness.  Musculoskeletal:        General: Tenderness present. Normal range of motion.     Cervical back: Normal range of motion and neck supple.     Right lower leg: No edema.     Left lower leg: No edema.  Lymphadenopathy:     Cervical: No cervical adenopathy.  Skin:    General: Skin is warm and dry.     Findings: No erythema or rash.  Neurological:     Mental Status: She is alert.     Coordination: Coordination normal.  Psychiatric:        Behavior: Behavior normal.     ED Results / Procedures / Treatments   Labs (all labs ordered are listed, but only abnormal results are displayed) Labs Reviewed  BASIC METABOLIC PANEL - Abnormal; Notable for the following components:      Result Value   Glucose, Bld 115 (*)    Creatinine, Ser 1.09 (*)    GFR, Estimated 56 (*)    All other components within normal limits  CBG MONITORING, ED - Abnormal; Notable for the following components:   Glucose-Capillary 111 (*)    All other components within normal limits  CBC    EKG EKG Interpretation  Date/Time:  Friday March 25 2022 15:50:59 EDT Ventricular Rate:  94 PR Interval:  143 QRS Duration: 96 QT Interval:  349 QTC Calculation: 437 R Axis:   68 Text Interpretation: Sinus rhythm Confirmed by Noemi Chapel [phone]) on 03/25/2022 5:43:29 PM  Radiology DG Ribs Unilateral  Left  Result Date: 03/25/2022 CLINICAL DATA:  Cough and shortness of breath for 3 weeks, pain under LEFT breast area, LEFT anterior rib pain EXAM: LEFT RIBS - 2 VIEW COMPARISON:  Chest radiographs 03/25/2022 FINDINGS: BB placed at site of symptoms lower LEFT ribs. No rib fracture or bone destruction. IMPRESSION: No acute osseous abnormalities. Electronically Signed  By: Lavonia Dana M.D.   On: 03/25/2022 16:15   DG Chest 2 View  Result Date: 03/25/2022 CLINICAL DATA:  Cough and shortness of breath for 3 weeks, LEFT anterior rib pain under breast area EXAM: CHEST - 2 VIEW COMPARISON:  12/19/2018 FINDINGS: Normal heart size, mediastinal contours, and pulmonary vascularity. Lungs clear. No pleural effusion or pneumothorax. Bones unremarkable. IMPRESSION: Normal exam. Electronically Signed   By: Lavonia Dana M.D.   On: 03/25/2022 16:14   DG Chest Port 1 View  Result Date: 03/25/2022 CLINICAL DATA:  Cough and shortness of breath EXAM: PORTABLE CHEST 1 VIEW COMPARISON:  Portable exam 1604 hours compared to earlier exam at 1510 hours FINDINGS: Normal heart size, mediastinal contours, and pulmonary vascularity. Lungs clear. No acute infiltrate, pleural effusion, or pneumothorax. Bones demineralized. IMPRESSION: No acute abnormalities. Electronically Signed   By: Lavonia Dana M.D.   On: 03/25/2022 16:12    Procedures Procedures    Medications Ordered in ED Medications  benzonatate (TESSALON) capsule 200 mg (200 mg Oral Given 03/25/22 1559)  predniSONE (DELTASONE) tablet 40 mg (40 mg Oral Given 03/25/22 1559)    ED Course/ Medical Decision Making/ A&P                           Medical Decision Making Amount and/or Complexity of Data Reviewed Labs: ordered. Radiology: ordered. ECG/medicine tests: ordered.  Risk Prescription drug management.   The patient was examined from head to toe and has tenderness over the left anterior ribs without crepitance or subcutaneous emphysema.  Her vital signs are  unremarkable with an oxygen of 96% on room air, heart rate of around 90 normal blood pressure and afebrile.  She has rib x-rays would have been performed, I do not see any obvious pneumothorax or obvious large rib fractures, I will do a formal chest x-ray to make sure both lungs are evaluated to make sure there is no developing pneumonia.  She has been given Tessalon and has appropriate metered-dose inhalers at home.  We will also give a course of prednisone, the patient is agreeable, EKG does not show any signs of ischemia  Labs: No significant anemia or electrolyte abnormalities  Imaging: Personally viewed and interpreted the x-ray which shows no signs of pneumonia or rib fractures I agree with radiologist interpretation  ED Course: Also given a incentive spirometer  Cardiac monitoring: Normal sinus rhythm, no arrhythmias, EKG unremarkable, patient informed of results and stable for discharge  I suspect that the patient's near syncopal event was because of the pain in her ribs especially when she takes a deep breath.  She has no other signs of abnormalities of her lung or ribs.  Pulmonary embolism seems much less likely, the patient is agreeable to return should symptoms worsen but appears stable for discharge at this time.       Final Clinical Impression(s) / ED Diagnoses Final diagnoses:  Near syncope  Subacute cough  Chest wall pain    Rx / DC Orders ED Discharge Orders          Ordered    benzonatate (TESSALON) 100 MG capsule  Every 8 hours        03/25/22 1744              Noemi Chapel, MD 03/25/22 1745    Noemi Chapel, MD 03/25/22 1745

## 2022-03-25 NOTE — ED Notes (Signed)
RT called for incentive spirometry

## 2022-03-25 NOTE — ED Triage Notes (Signed)
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  Reports had covid 4 weeks ago and still having cough.  Pt c/o pain to left ribs.  Pt unsure if she passed out or not.  Staff says she doesn't think she passed out.  Pt says thinks she hit the back of her head.  Pt has bruising to left wrist but says is from another fall a few days ago.  Reports also fell today while trying to hang a picture.

## 2022-03-25 NOTE — Discharge Instructions (Signed)
Thankfully your x-ray shows no signs of pneumonia and no signs of broken ribs.  I want you to take an albuterol inhaler 2 puffs every 4 hours as needed, you may use your nebulizer as well.  I have given you a separate cough medication called benzonatate or Tessalon which can be taken every 8 hours as needed for coughing.  You should follow-up with your doctor in 1 week if no better, ER for severe worsening symptoms

## 2022-03-28 ENCOUNTER — Encounter (HOSPITAL_COMMUNITY): Payer: Self-pay | Admitting: *Deleted

## 2022-03-28 ENCOUNTER — Emergency Department (HOSPITAL_COMMUNITY)
Admission: EM | Admit: 2022-03-28 | Discharge: 2022-03-28 | Disposition: A | Discharge status: Home / Self Care | Payer: Medicare HMO | Attending: Emergency Medicine | Admitting: Emergency Medicine

## 2022-03-28 ENCOUNTER — Emergency Department (HOSPITAL_COMMUNITY): Payer: Medicare HMO

## 2022-03-28 ENCOUNTER — Other Ambulatory Visit: Payer: Self-pay

## 2022-03-28 DIAGNOSIS — J45909 Unspecified asthma, uncomplicated: Secondary | ICD-10-CM | POA: Diagnosis not present

## 2022-03-28 DIAGNOSIS — D72829 Elevated white blood cell count, unspecified: Secondary | ICD-10-CM | POA: Diagnosis not present

## 2022-03-28 DIAGNOSIS — E039 Hypothyroidism, unspecified: Secondary | ICD-10-CM | POA: Insufficient documentation

## 2022-03-28 DIAGNOSIS — I10 Essential (primary) hypertension: Secondary | ICD-10-CM | POA: Diagnosis not present

## 2022-03-28 DIAGNOSIS — Z8616 Personal history of COVID-19: Secondary | ICD-10-CM | POA: Insufficient documentation

## 2022-03-28 DIAGNOSIS — G43811 Other migraine, intractable, with status migrainosus: Secondary | ICD-10-CM

## 2022-03-28 DIAGNOSIS — I1A0 Resistant hypertension: Secondary | ICD-10-CM | POA: Insufficient documentation

## 2022-03-28 DIAGNOSIS — G43909 Migraine, unspecified, not intractable, without status migrainosus: Secondary | ICD-10-CM | POA: Diagnosis not present

## 2022-03-28 DIAGNOSIS — Z79899 Other long term (current) drug therapy: Secondary | ICD-10-CM | POA: Diagnosis not present

## 2022-03-28 DIAGNOSIS — R110 Nausea: Secondary | ICD-10-CM | POA: Insufficient documentation

## 2022-03-28 DIAGNOSIS — R519 Headache, unspecified: Secondary | ICD-10-CM | POA: Diagnosis not present

## 2022-03-28 LAB — COMPREHENSIVE METABOLIC PANEL
ALT: 41 U/L (ref 0–44)
AST: 33 U/L (ref 15–41)
Albumin: 4.2 g/dL (ref 3.5–5.0)
Alkaline Phosphatase: 68 U/L (ref 38–126)
Anion gap: 9 (ref 5–15)
BUN: 28 mg/dL — ABNORMAL HIGH (ref 8–23)
CO2: 27 mmol/L (ref 22–32)
Calcium: 9.6 mg/dL (ref 8.9–10.3)
Chloride: 103 mmol/L (ref 98–111)
Creatinine, Ser: 1.1 mg/dL — ABNORMAL HIGH (ref 0.44–1.00)
GFR, Estimated: 55 mL/min — ABNORMAL LOW (ref >60)
Glucose, Bld: 102 mg/dL — ABNORMAL HIGH (ref 70–99)
Potassium: 3.8 mmol/L (ref 3.5–5.1)
Sodium: 139 mmol/L (ref 135–145)
Total Bilirubin: 0.5 mg/dL (ref 0.3–1.2)
Total Protein: 7.6 g/dL (ref 6.5–8.1)

## 2022-03-28 LAB — CBC WITH DIFFERENTIAL/PLATELET
Abs Immature Granulocytes: 0.27 10*3/uL — ABNORMAL HIGH (ref 0.00–0.07)
Basophils Absolute: 0.1 10*3/uL (ref 0.0–0.1)
Basophils Relative: 0 %
Eosinophils Absolute: 0 10*3/uL (ref 0.0–0.5)
Eosinophils Relative: 0 %
HCT: 40.7 % (ref 36.0–46.0)
Hemoglobin: 13.4 g/dL (ref 12.0–15.0)
Immature Granulocytes: 2 %
Lymphocytes Relative: 16 %
Lymphs Abs: 2.3 10*3/uL (ref 0.7–4.0)
MCH: 30.9 pg (ref 26.0–34.0)
MCHC: 32.9 g/dL (ref 30.0–36.0)
MCV: 93.8 fL (ref 80.0–100.0)
Monocytes Absolute: 0.9 10*3/uL (ref 0.1–1.0)
Monocytes Relative: 6 %
Neutro Abs: 11.1 10*3/uL — ABNORMAL HIGH (ref 1.7–7.7)
Neutrophils Relative %: 76 %
Platelets: 256 10*3/uL (ref 150–400)
RBC: 4.34 MIL/uL (ref 3.87–5.11)
RDW: 14.6 % (ref 11.5–15.5)
WBC: 14.6 10*3/uL — ABNORMAL HIGH (ref 4.0–10.5)
nRBC: 0 % (ref 0.0–0.2)

## 2022-03-28 LAB — TSH: TSH: 0.883 u[IU]/mL (ref 0.350–4.500)

## 2022-03-28 MED ORDER — HYDROMORPHONE HCL 1 MG/ML IJ SOLN
1.0000 mg | Freq: Once | INTRAMUSCULAR | Status: AC
  Administered 2022-03-28: 1 mg via INTRAVENOUS
  Filled 2022-03-28: qty 1

## 2022-03-28 MED ORDER — ONDANSETRON HCL 4 MG/2ML IJ SOLN
4.0000 mg | Freq: Once | INTRAMUSCULAR | Status: AC
  Administered 2022-03-28: 4 mg via INTRAVENOUS
  Filled 2022-03-28: qty 2

## 2022-03-28 MED ORDER — AMLODIPINE BESYLATE 5 MG PO TABS: 5.0000 mg | ORAL_TABLET | Freq: Every day | ORAL | 1 refills | Status: AC

## 2022-03-28 MED ORDER — METOCLOPRAMIDE HCL 5 MG/ML IJ SOLN
10.0000 mg | Freq: Once | INTRAMUSCULAR | Status: AC
  Administered 2022-03-28: 10 mg via INTRAVENOUS
  Filled 2022-03-28: qty 2

## 2022-03-28 MED ORDER — AMLODIPINE BESYLATE 5 MG PO TABS
5.0000 mg | ORAL_TABLET | Freq: Once | ORAL | Status: AC
  Administered 2022-03-28: 5 mg via ORAL
  Filled 2022-03-28: qty 1

## 2022-03-28 MED ORDER — DIPHENHYDRAMINE HCL 50 MG/ML IJ SOLN
25.0000 mg | Freq: Once | INTRAMUSCULAR | Status: AC
  Administered 2022-03-28: 25 mg via INTRAVENOUS
  Filled 2022-03-28: qty 1

## 2022-03-28 MED ORDER — SODIUM CHLORIDE 0.9 % IV BOLUS
1000.0000 mL | Freq: Once | INTRAVENOUS | Status: AC
  Administered 2022-03-28: 1000 mL via INTRAVENOUS

## 2022-03-28 MED ORDER — DEXAMETHASONE SODIUM PHOSPHATE 10 MG/ML IJ SOLN
10.0000 mg | Freq: Once | INTRAMUSCULAR | Status: AC
  Administered 2022-03-28: 10 mg via INTRAVENOUS
  Filled 2022-03-28: qty 1

## 2022-03-28 MED ORDER — SODIUM CHLORIDE 0.9 % IV SOLN: INTRAVENOUS | Status: DC

## 2022-03-28 NOTE — ED Notes (Signed)
ED Provider at bedside. 

## 2022-03-28 NOTE — Discharge Instructions (Addendum)
Continue the Cozaar start the amlodipine as well to help control your blood pressure.  He received a migraine cocktail here today hopefully the Decadron component will kick in and abate the headache over night.  Return for any new or worse symptoms.  Head CT without any acute findings.

## 2022-03-28 NOTE — ED Provider Notes (Addendum)
[facility] EMERGENCY DEPARTMENT Provider Note   CSN: [id number] Arrival date & time: 03/28/22  0719     History  Chief Complaint  Patient presents with   Hypertension    Rebekah Gonzalez is a 66 y.o. female.  Patient returns today after being seen on Friday.  Patient had COVID about a month ago.'s been struggling with blood pressure that is difficult to control since that time.  And has been on Cozaar 50 mg for about 5 years.  Recently Dr. Riley Kill increased to 200 mg.  Patient also has a history of migraines complaining of a headache that is somewhat like her migraines all over that is been fairly intense for the past 2 to 3 days.  No fevers.  They relate that since she had COVID she just has not felt well.  Patient not on any other blood pressure medicine other than the Cozaar.  Past medical history significant for anxiety depression hypertension hypothyroidism and asthma.  Past surgical history significant for cholecystectomy and appendectomy.  Patient is never used tobacco products.  Has had some nausea patient denies any visual changes any weakness or sensory changes.  No fevers.  No neck stiffness.  Blood pressure upon arrival here was 185/121.  Temp 98.2 pulse 92 oxygen saturation is 99% and respirations 20.       Home Medications Prior to Admission medications   Medication Sig Start Date End Date Taking? Authorizing Provider  albuterol (PROVENTIL) 2 MG tablet Take 2 mg by mouth 3 (three) times daily. 03/25/22  Yes [provider]  albuterol (VENTOLIN HFA) 108 (90 Base) MCG/ACT inhaler Inhale 1-2 puffs into the lungs every 4 (four) hours as needed for wheezing. 12/18/18  Yes [provider]  ALPRAZolam Duanne Moron) 1 MG tablet Take 1 mg by mouth 3 (three) times daily. 10/14/14  Yes [provider]  amLODipine (NORVASC) 5 MG tablet Take 1 tablet (5 mg total) by mouth daily. 03/28/22  Yes Fredia Sorrow, MD  atorvastatin (LIPITOR) 10 MG tablet Take 1 tablet by  mouth daily. 01/09/19  Yes [provider]  benzonatate (TESSALON) 100 MG capsule Take 1 capsule (100 mg total) by mouth every 8 (eight) hours. 03/25/22  Yes Noemi Chapel, MD  doxycycline (VIBRA-TABS) 100 MG tablet Take 100 mg by mouth 2 (two) times daily. 03/25/22  Yes [provider]  furosemide (LASIX) 20 MG tablet Take 20 mg by mouth in the morning, at noon, in the evening, and at bedtime.   Yes [provider]  ibuprofen (ADVIL,MOTRIN) 800 MG tablet Take 800 mg by mouth every 8 (eight) hours as needed for mild pain or moderate pain.   Yes [provider]  levothyroxine (SYNTHROID, LEVOTHROID) 75 MCG tablet Take 75 mcg by mouth daily. 10/28/14  Yes [provider]  losartan (COZAAR) 50 MG tablet Take 100 mg by mouth daily. 01/09/19  Yes [provider]  methylPREDNISolone (MEDROL DOSEPAK) 4 MG TBPK tablet Take 1 tablet by mouth as directed. 03/25/22  Yes [provider]  mirtazapine (REMERON) 45 MG tablet Take 45 mg by mouth at bedtime.   Yes [provider]  montelukast (SINGULAIR) 10 MG tablet Take 10 mg by mouth daily. 11/24/20  Yes [provider]  tiZANidine (ZANAFLEX) 4 MG tablet Take 4 mg by mouth 4 (four) times daily as needed for muscle spasms. 11/04/20  Yes [provider]  venlafaxine (EFFEXOR) 37.5 MG tablet Take 37.5 mg by mouth 2 (two) times daily. 01/19/22  Yes  [provider]      Allergies    Citalopram hydrobromide, Codeine, Eszopiclone, Paroxetine, and Sertraline hcl    Review of Systems   Review of Systems  Constitutional:  Negative for chills and fever.  HENT:  Negative for ear pain and sore throat.   Eyes:  Negative for pain and visual disturbance.  Respiratory:  Negative for cough and shortness of breath.   Cardiovascular:  Negative for chest pain and palpitations.  Gastrointestinal:  Negative for abdominal pain and vomiting.  Genitourinary:  Negative for dysuria and hematuria.   Musculoskeletal:  Negative for arthralgias and back pain.  Skin:  Negative for color change and rash.  Neurological:  Positive for headaches. Negative for seizures and syncope.  All other systems reviewed and are negative.   Physical Exam Updated Vital Signs BP (!) 170/95   Pulse 81   Temp 97.9 F (36.6 C) (Oral)   Resp 17   Ht 1.6 m ('5\' 3"'$ )   Wt 83 kg   SpO2 94%   BMI 32.41 kg/m  Physical Exam Vitals and nursing note reviewed.  Constitutional:      General: She is not in acute distress.    Appearance: Normal appearance. She is well-developed. She is not diaphoretic.  HENT:     Head: Normocephalic and atraumatic.     Mouth/Throat:     Mouth: Mucous membranes are moist.  Eyes:     Conjunctiva/sclera: Conjunctivae normal.     Pupils: Pupils are equal, round, and reactive to light.  Cardiovascular:     Rate and Rhythm: Normal rate and regular rhythm.     Heart sounds: No murmur heard. Pulmonary:     Effort: Pulmonary effort is normal. No respiratory distress.     Breath sounds: Normal breath sounds.  Abdominal:     Palpations: Abdomen is soft.     Tenderness: There is no abdominal tenderness.  Musculoskeletal:        General: No swelling.     Cervical back: Neck supple. No rigidity.     Right lower leg: No edema.     Left lower leg: No edema.  Skin:    General: Skin is warm and dry.     Capillary Refill: Capillary refill takes less than 2 seconds.  Neurological:     General: No focal deficit present.     Mental Status: She is alert and oriented to person, place, and time.     Cranial Nerves: No cranial nerve deficit.     Sensory: No sensory deficit.  Psychiatric:        Mood and Affect: Mood normal.     ED Results / Procedures / Treatments   Labs (all labs ordered are listed, but only abnormal results are displayed) Labs Reviewed  CBC WITH DIFFERENTIAL/PLATELET - Abnormal; Notable for the following components:      Result Value   WBC 14.6 (*)    Neutro  Abs 11.1 (*)    Abs Immature Granulocytes 0.27 (*)    All other components within normal limits  COMPREHENSIVE METABOLIC PANEL - Abnormal; Notable for the following components:   Glucose, Bld 102 (*)    BUN 28 (*)    Creatinine, Ser 1.10 (*)    GFR, Estimated 55 (*)    All other components within normal limits  TSH    EKG EKG Interpretation  Date/Time:  Monday March 28 2022 08:40:28 EDT Ventricular Rate:  73 PR Interval:  138 QRS Duration: 69 QT Interval:  408 QTC Calculation: 450 R Axis:   62 Text Interpretation: Sinus rhythm Low voltage, precordial leads Confirmed by Fredia Sorrow [phone]) on 03/28/2022 8:42:21 AM  Radiology CT Head Wo Contrast  Result Date: 03/28/2022 CLINICAL DATA:  Hypertension and headache EXAM: CT HEAD WITHOUT CONTRAST TECHNIQUE: Contiguous axial images were obtained from the base of the skull through the vertex without intravenous contrast. RADIATION DOSE REDUCTION: This exam was performed according to the departmental dose-optimization program which includes automated exposure control, adjustment of the mA and/or kV according to patient size and/or use of iterative reconstruction technique. COMPARISON:  Brain MRI 06/17/2004 FINDINGS: Brain: There is no acute intracranial hemorrhage, extra-axial fluid collection, or acute infarct. Parenchymal volume is normal. The ventricles are normal in size. Gray-white differentiation is preserved. There is no mass lesion.  There is no mass effect or midline shift. Vascular: No hyperdense vessel or unexpected calcification. Skull: Choose Sinuses/Orbits: There is mild mucosal thickening in the right sphenoid sinus. The globes and orbits are unremarkable. Other: None. IMPRESSION: No acute intracranial pathology. Electronically Signed   By: Valetta Mole M.D.   On: 03/28/2022 10:29   DG Chest Port 1 View  Result Date: 03/28/2022 CLINICAL DATA:  Hypertension EXAM: PORTABLE CHEST 1 VIEW COMPARISON:  Chest radiograph 03/25/2022  FINDINGS: No pleural effusion. No pneumothorax. Unchanged cardiac and mediastinal contours. No focal airspace opacity. Visualized upper abdomen is unremarkable. No displaced rib fractures. Mild degenerative changes the bilateral glenohumeral and AC joints. IMPRESSION: No focal airspace opacity. Electronically Signed   By: Marin Roberts M.D.   On: 03/28/2022 08:31    Procedures Procedures    Medications Ordered in ED Medications  0.9 %  sodium chloride infusion (0 mLs Intravenous Stopped 03/28/22 1327)  sodium chloride 0.9 % bolus 1,000 mL (0 mLs Intravenous Stopped 03/28/22 0933)  dexamethasone (DECADRON) injection 10 mg (10 mg Intravenous Given 03/28/22 0853)  diphenhydrAMINE (BENADRYL) injection 25 mg (25 mg Intravenous Given 03/28/22 0853)  metoCLOPramide (REGLAN) injection 10 mg (10 mg Intravenous Given 03/28/22 0852)  HYDROmorphone (DILAUDID) injection 1 mg (1 mg Intravenous Given 03/28/22 1056)  HYDROmorphone (DILAUDID) injection 1 mg (1 mg Intravenous Given 03/28/22 1326)  amLODipine (NORVASC) tablet 5 mg (5 mg Oral Given 03/28/22 1354)  ondansetron (ZOFRAN) injection 4 mg (4 mg Intravenous Given 03/28/22 1406)    ED Course/ Medical Decision Making/ A&P                           Medical Decision Making Amount and/or Complexity of Data Reviewed Labs: ordered. Radiology: ordered.  Risk Prescription drug management.   Patient states that headache not unlike her migraines.  Most recently had a migraine about a month ago.  Felt that maybe blood pressure was elevated some based on that.  Patient given migraine cocktail with Decadron Reglan Benadryl and IV fluids.  This helped some but the headache did not significantly resolved.  Blood pressure did come down some to more of the systolic in the 010 range.  Patient with a little bit of a leukocytosis white count 14.6 hemoglobin 27.2 complete metabolic panel significant for creatinine of 1.10 GFR 55 a little off from baseline patient's GFR is  usually a little bit higher.  Liver function test normal.  TSH 0.883 so that is normalized.  CT head no acute findings chest x-ray no focal airspace opacities.  Patient also given Dilaudid total of 2 mg.  And blood pressure kind of stayed in the 1  09-8 70 systolic range with diastolics around 95.  Patient not showing any evidence of any endorgan failure.  So not meeting criteria for hypertensive emergency.  Patient started on amlodipine which would be a new medicine for her.  We will have her continue that follow-up closely with Dr. Riley Kill to trend the blood pressures.  And hopefully the Decadron will abate the headache as time goes on.  No evidence of any significant findings on head CT no evidence of any endorgan significant abnormalities.  Patient stable for discharge home close follow-up with primary care doctor on blood pressure control.   Final Clinical Impression(s) / ED Diagnoses Final diagnoses:  Resistant hypertension  Other migraine with status migrainosus, intractable    Rx / DC Orders ED Discharge Orders          Ordered    amLODipine (NORVASC) 5 MG tablet  Daily        03/28/22 1539              Fredia Sorrow, MD 03/28/22 1549    Fredia Sorrow, MD 03/28/22 1558

## 2022-03-28 NOTE — ED Triage Notes (Signed)
Pt states she has been unable to get her blood pressure down all weekend due to increasing the dose on her BP meds; pt c/o headache

## 2022-03-28 NOTE — ED Notes (Signed)
Water given per [employer].

## 2022-03-29 DIAGNOSIS — R32 Unspecified urinary incontinence: Secondary | ICD-10-CM | POA: Diagnosis not present

## 2022-03-29 DIAGNOSIS — Z809 Family history of malignant neoplasm, unspecified: Secondary | ICD-10-CM | POA: Diagnosis not present

## 2022-03-29 DIAGNOSIS — M62838 Other muscle spasm: Secondary | ICD-10-CM | POA: Diagnosis not present

## 2022-03-29 DIAGNOSIS — Z6832 Body mass index (BMI) 32.0-32.9, adult: Secondary | ICD-10-CM | POA: Diagnosis not present

## 2022-03-29 DIAGNOSIS — E039 Hypothyroidism, unspecified: Secondary | ICD-10-CM | POA: Diagnosis not present

## 2022-03-29 DIAGNOSIS — E669 Obesity, unspecified: Secondary | ICD-10-CM | POA: Diagnosis not present

## 2022-03-29 DIAGNOSIS — E785 Hyperlipidemia, unspecified: Secondary | ICD-10-CM | POA: Diagnosis not present

## 2022-03-29 DIAGNOSIS — F419 Anxiety disorder, unspecified: Secondary | ICD-10-CM | POA: Diagnosis not present

## 2022-03-29 DIAGNOSIS — I10 Essential (primary) hypertension: Secondary | ICD-10-CM | POA: Diagnosis not present

## 2022-03-29 DIAGNOSIS — K219 Gastro-esophageal reflux disease without esophagitis: Secondary | ICD-10-CM | POA: Diagnosis not present

## 2022-03-29 DIAGNOSIS — G4700 Insomnia, unspecified: Secondary | ICD-10-CM | POA: Diagnosis not present

## 2022-03-29 DIAGNOSIS — J45909 Unspecified asthma, uncomplicated: Secondary | ICD-10-CM | POA: Diagnosis not present

## 2022-04-04 DIAGNOSIS — R55 Syncope and collapse: Secondary | ICD-10-CM | POA: Diagnosis not present

## 2022-04-04 DIAGNOSIS — J45909 Unspecified asthma, uncomplicated: Secondary | ICD-10-CM | POA: Diagnosis not present

## 2022-04-04 DIAGNOSIS — I10 Essential (primary) hypertension: Secondary | ICD-10-CM | POA: Diagnosis not present

## 2022-04-04 DIAGNOSIS — E663 Overweight: Secondary | ICD-10-CM | POA: Diagnosis not present

## 2022-04-04 DIAGNOSIS — K219 Gastro-esophageal reflux disease without esophagitis: Secondary | ICD-10-CM | POA: Diagnosis not present

## 2022-04-04 DIAGNOSIS — Z6829 Body mass index (BMI) 29.0-29.9, adult: Secondary | ICD-10-CM | POA: Diagnosis not present

## 2022-07-13 DIAGNOSIS — Z0001 Encounter for general adult medical examination with abnormal findings: Secondary | ICD-10-CM | POA: Diagnosis not present

## 2022-07-13 DIAGNOSIS — Z1331 Encounter for screening for depression: Secondary | ICD-10-CM | POA: Diagnosis not present

## 2022-07-13 DIAGNOSIS — E663 Overweight: Secondary | ICD-10-CM | POA: Diagnosis not present

## 2022-07-13 DIAGNOSIS — E063 Autoimmune thyroiditis: Secondary | ICD-10-CM | POA: Diagnosis not present

## 2022-07-13 DIAGNOSIS — E039 Hypothyroidism, unspecified: Secondary | ICD-10-CM | POA: Diagnosis not present

## 2022-07-13 DIAGNOSIS — R81 Glycosuria: Secondary | ICD-10-CM | POA: Diagnosis not present

## 2022-07-13 DIAGNOSIS — Z6828 Body mass index (BMI) 28.0-28.9, adult: Secondary | ICD-10-CM | POA: Diagnosis not present

## 2022-07-13 DIAGNOSIS — R7309 Other abnormal glucose: Secondary | ICD-10-CM | POA: Diagnosis not present

## 2022-07-13 DIAGNOSIS — M4306 Spondylolysis, lumbar region: Secondary | ICD-10-CM | POA: Diagnosis not present

## 2022-07-13 DIAGNOSIS — N390 Urinary tract infection, site not specified: Secondary | ICD-10-CM | POA: Diagnosis not present

## 2022-07-13 DIAGNOSIS — D518 Other vitamin B12 deficiency anemias: Secondary | ICD-10-CM | POA: Diagnosis not present

## 2022-07-13 DIAGNOSIS — E559 Vitamin D deficiency, unspecified: Secondary | ICD-10-CM | POA: Diagnosis not present

## 2022-07-13 DIAGNOSIS — M545 Low back pain, unspecified: Secondary | ICD-10-CM | POA: Diagnosis not present

## 2022-07-14 ENCOUNTER — Other Ambulatory Visit (HOSPITAL_COMMUNITY): Payer: Self-pay | Admitting: Internal Medicine

## 2022-07-14 DIAGNOSIS — N189 Chronic kidney disease, unspecified: Secondary | ICD-10-CM

## 2022-07-30 DIAGNOSIS — R69 Illness, unspecified: Secondary | ICD-10-CM | POA: Diagnosis not present

## 2022-08-08 IMAGING — US US PELVIS COMPLETE WITH TRANSVAGINAL
1 series · 13 of 25 positions shown · non-contrast
Comparison: None

CLINICAL DATA: Post menopausal bleeding

EXAM:
TRANSABDOMINAL AND TRANSVAGINAL ULTRASOUND OF PELVIS
TECHNIQUE: Both transabdominal and transvaginal ultrasound examinations of the
pelvis were performed. Transabdominal technique was performed for
global imaging of the pelvis including uterus, ovaries, adnexal
regions, and pelvic cul-de-sac. It was necessary to proceed with
endovaginal exam following the transabdominal exam to visualize the
uterus endometrium ovaries.

[Series 1: us pelvic complete with transvaginal · 13 of 74 slices shown]
[im 1/74]
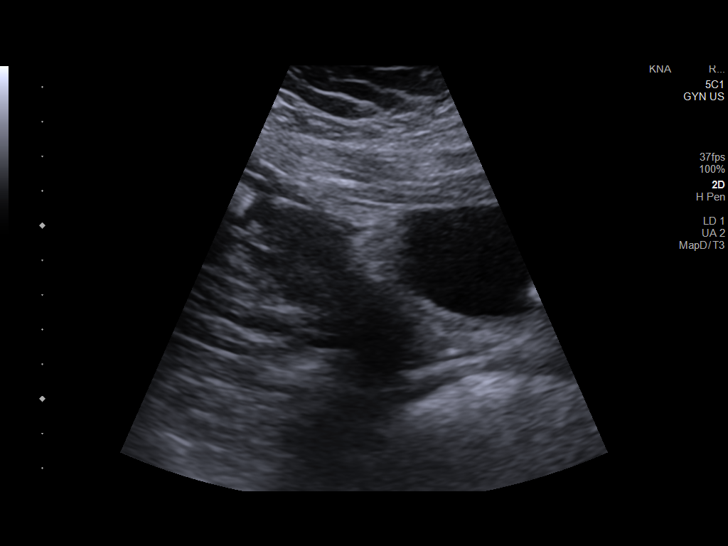
[im 7/74]
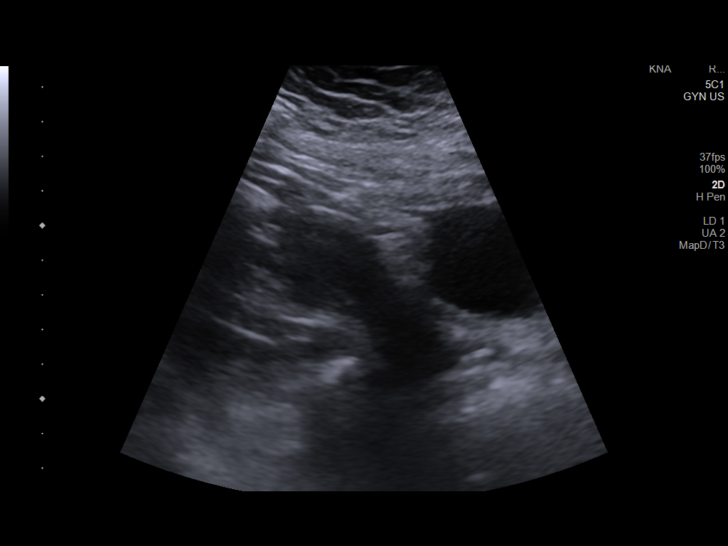
[im 13/74]
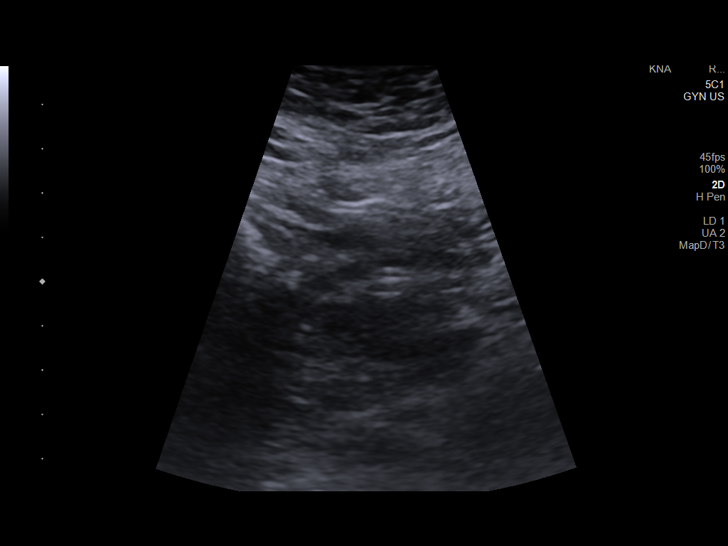
[im 19/74]
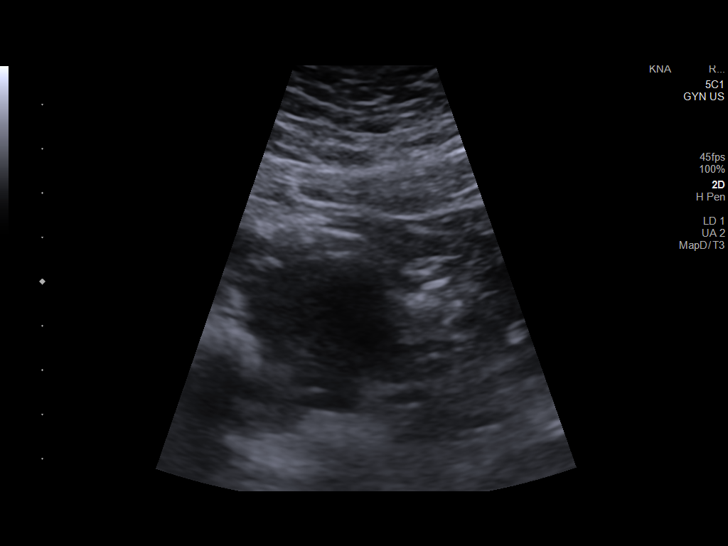
[im 25/74]
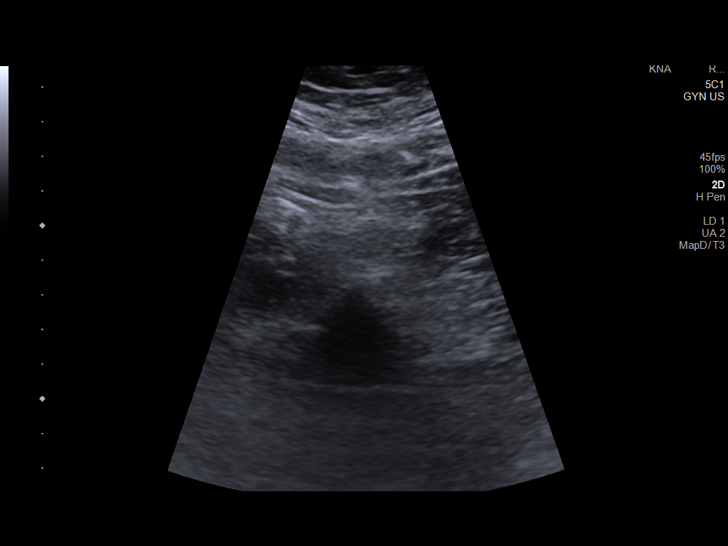
[im 31/74]
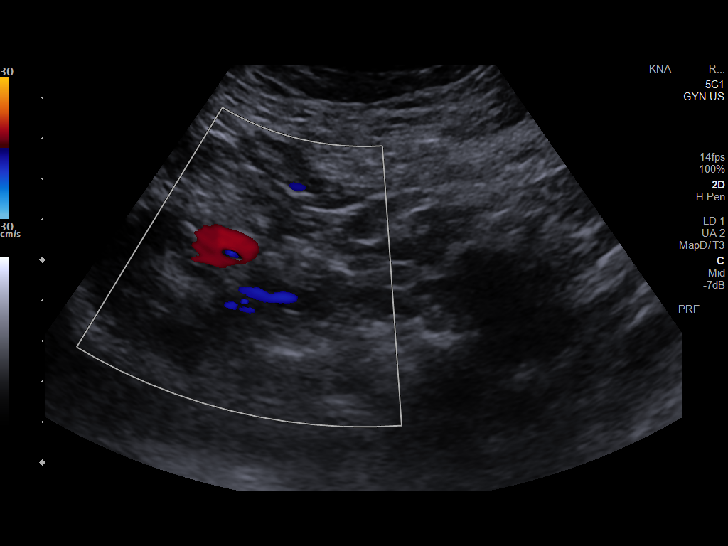
[im 37/74]
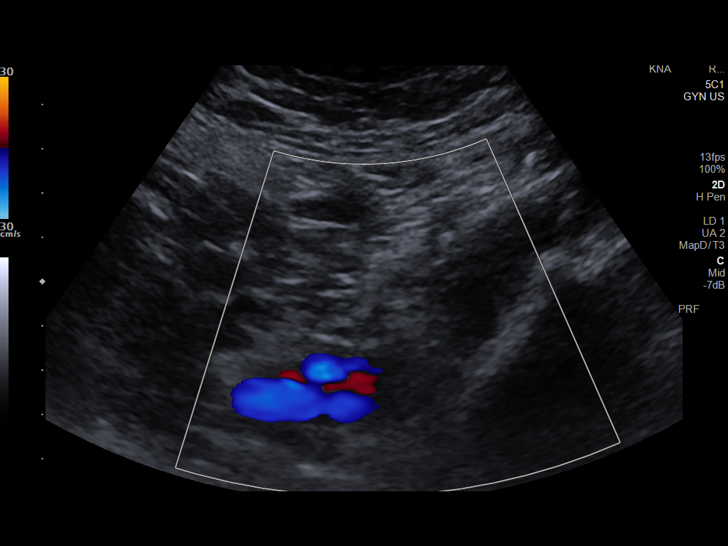
[im 43/74]
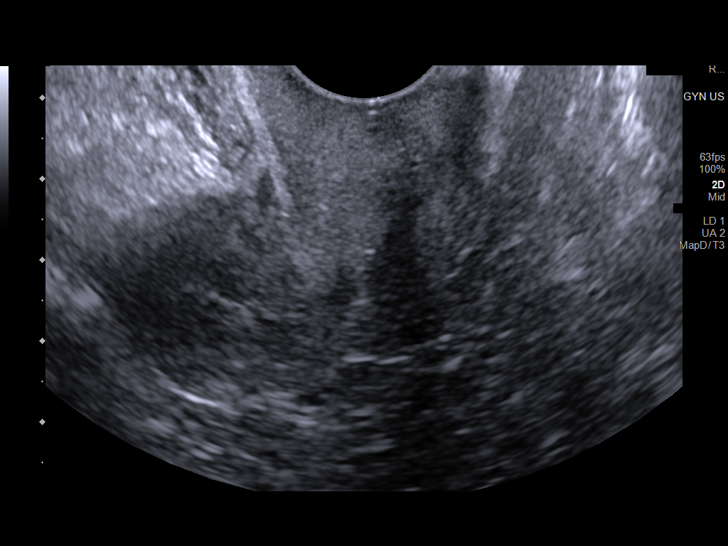
[im 49/74]
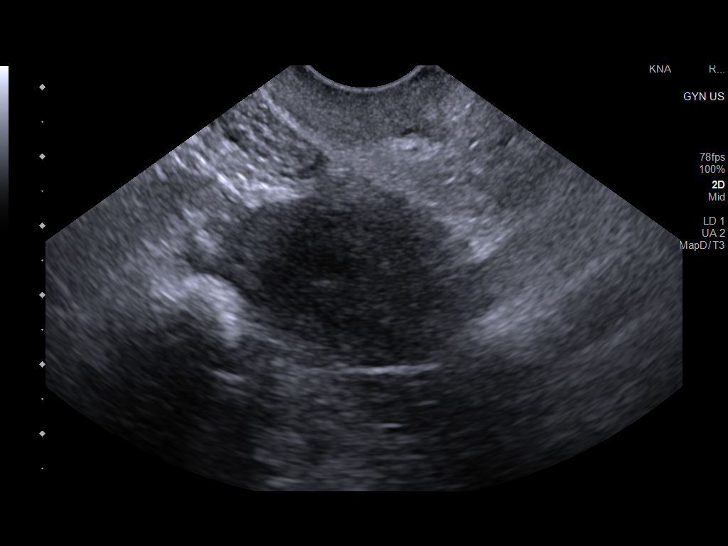
[im 55/74]
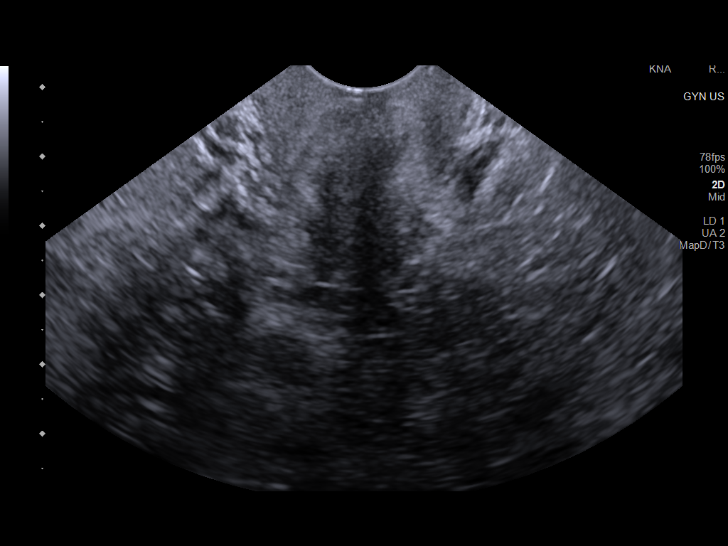
[im 61/74]
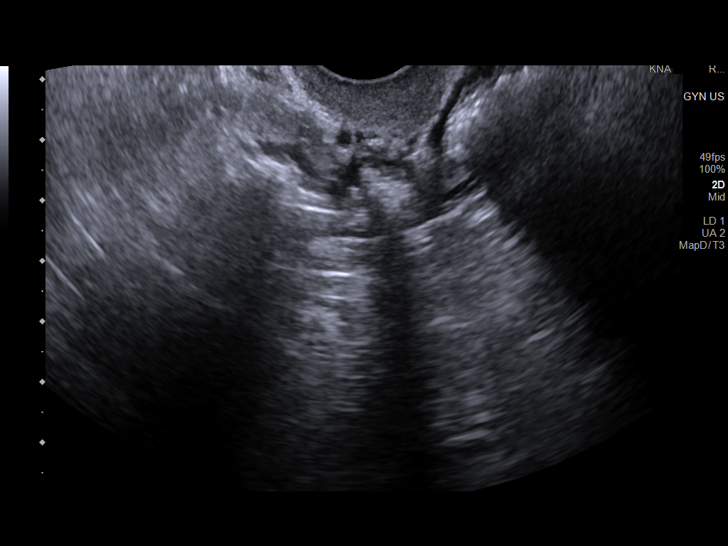
[im 67/74]
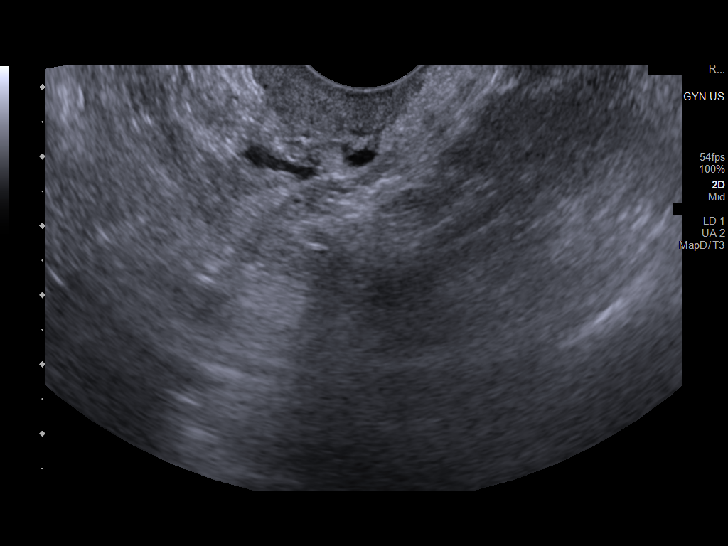
[im 74/74]
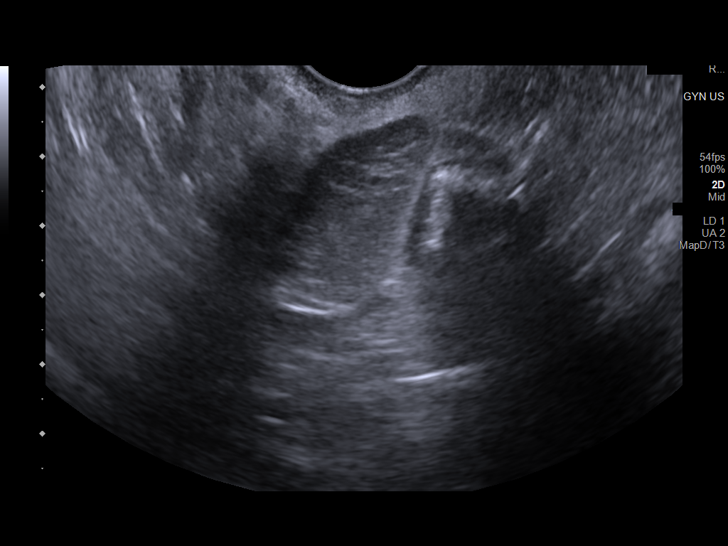

[13 of 25 positions shown; findings below may reference images not displayed]

FINDINGS: Uterus

Measurements: 6.4 x 2.5 x 3.4 cm = volume: 27.9 mL. No fibroids or
other mass visualized.

Endometrium

Thickness: 9.6 mm.  No focal abnormality visualized.

Right ovary

Measurements: 2.8 x 2.1 x 2.3 cm = volume: 7.3 mL. Normal
appearance/no adnexal mass.

Left ovary

Not seen

Other findings

No abnormal free fluid.
IMPRESSION: 1. Endometrial thickness of 9.6 mm. In the setting of
post-menopausal bleeding, endometrial sampling is indicated to
exclude carcinoma. If results are benign, sonohysterogram should be
considered for focal lesion work-up. (Ref: Radiological Reasoning:
Algorithmic Workup of Abnormal Vaginal Bleeding with Endovaginal
Sonography and Sonohysterography. AJR [id number]; 191:S68-73)
2. Nonvisualized left ovary

## 2022-08-09 ENCOUNTER — Ambulatory Visit (HOSPITAL_COMMUNITY)
Admission: RE | Admit: 2022-08-09 | Discharge: 2022-08-09 | Disposition: A | Discharge status: Home / Self Care | Payer: Medicare HMO | Source: Ambulatory Visit | Attending: Internal Medicine | Admitting: Internal Medicine

## 2022-08-09 DIAGNOSIS — N189 Chronic kidney disease, unspecified: Secondary | ICD-10-CM

## 2022-08-18 DIAGNOSIS — I129 Hypertensive chronic kidney disease with stage 1 through stage 4 chronic kidney disease, or unspecified chronic kidney disease: Secondary | ICD-10-CM | POA: Diagnosis not present

## 2022-08-18 DIAGNOSIS — K219 Gastro-esophageal reflux disease without esophagitis: Secondary | ICD-10-CM | POA: Diagnosis not present

## 2022-08-18 DIAGNOSIS — N1831 Chronic kidney disease, stage 3a: Secondary | ICD-10-CM | POA: Diagnosis not present

## 2022-08-18 DIAGNOSIS — M545 Low back pain, unspecified: Secondary | ICD-10-CM | POA: Diagnosis not present

## 2022-08-18 DIAGNOSIS — N179 Acute kidney failure, unspecified: Secondary | ICD-10-CM | POA: Diagnosis not present

## 2022-08-18 DIAGNOSIS — N390 Urinary tract infection, site not specified: Secondary | ICD-10-CM | POA: Diagnosis not present

## 2022-09-05 ENCOUNTER — Emergency Department (HOSPITAL_COMMUNITY)
Admission: EM | Admit: 2022-09-05 | Discharge: 2022-09-05 | Disposition: A | Discharge status: Home / Self Care | Payer: Medicare HMO | Attending: Emergency Medicine | Admitting: Emergency Medicine

## 2022-09-05 ENCOUNTER — Encounter (HOSPITAL_COMMUNITY): Payer: Self-pay

## 2022-09-05 ENCOUNTER — Emergency Department (HOSPITAL_COMMUNITY): Payer: Medicare HMO

## 2022-09-05 ENCOUNTER — Other Ambulatory Visit: Payer: Self-pay

## 2022-09-05 DIAGNOSIS — M4306 Spondylolysis, lumbar region: Secondary | ICD-10-CM | POA: Diagnosis not present

## 2022-09-05 DIAGNOSIS — Z79899 Other long term (current) drug therapy: Secondary | ICD-10-CM | POA: Insufficient documentation

## 2022-09-05 DIAGNOSIS — R1013 Epigastric pain: Secondary | ICD-10-CM | POA: Diagnosis not present

## 2022-09-05 DIAGNOSIS — R1031 Right lower quadrant pain: Secondary | ICD-10-CM

## 2022-09-05 DIAGNOSIS — J45909 Unspecified asthma, uncomplicated: Secondary | ICD-10-CM | POA: Diagnosis not present

## 2022-09-05 DIAGNOSIS — I10 Essential (primary) hypertension: Secondary | ICD-10-CM | POA: Insufficient documentation

## 2022-09-05 DIAGNOSIS — N281 Cyst of kidney, acquired: Secondary | ICD-10-CM | POA: Diagnosis not present

## 2022-09-05 DIAGNOSIS — E039 Hypothyroidism, unspecified: Secondary | ICD-10-CM | POA: Insufficient documentation

## 2022-09-05 DIAGNOSIS — Z6827 Body mass index (BMI) 27.0-27.9, adult: Secondary | ICD-10-CM | POA: Diagnosis not present

## 2022-09-05 DIAGNOSIS — K219 Gastro-esophageal reflux disease without esophagitis: Secondary | ICD-10-CM | POA: Diagnosis not present

## 2022-09-05 DIAGNOSIS — E663 Overweight: Secondary | ICD-10-CM | POA: Diagnosis not present

## 2022-09-05 DIAGNOSIS — R197 Diarrhea, unspecified: Secondary | ICD-10-CM | POA: Diagnosis not present

## 2022-09-05 LAB — COMPREHENSIVE METABOLIC PANEL
ALT: 44 U/L (ref 0–44)
AST: 44 U/L — ABNORMAL HIGH (ref 15–41)
Albumin: 4.1 g/dL (ref 3.5–5.0)
Alkaline Phosphatase: 55 U/L (ref 38–126)
Anion gap: 9 (ref 5–15)
BUN: 11 mg/dL (ref 8–23)
CO2: 23 mmol/L (ref 22–32)
Calcium: 9.9 mg/dL (ref 8.9–10.3)
Chloride: 104 mmol/L (ref 98–111)
Creatinine, Ser: 1.06 mg/dL — ABNORMAL HIGH (ref 0.44–1.00)
GFR, Estimated: 58 mL/min — ABNORMAL LOW (ref >60)
Glucose, Bld: 90 mg/dL (ref 70–99)
Potassium: 3.3 mmol/L — ABNORMAL LOW (ref 3.5–5.1)
Sodium: 136 mmol/L (ref 135–145)
Total Bilirubin: 1 mg/dL (ref 0.3–1.2)
Total Protein: 7.4 g/dL (ref 6.5–8.1)

## 2022-09-05 LAB — URINALYSIS, ROUTINE W REFLEX MICROSCOPIC
Bilirubin Urine: NEGATIVE
Glucose, UA: NEGATIVE mg/dL
Hgb urine dipstick: NEGATIVE
Ketones, ur: NEGATIVE mg/dL
Leukocytes,Ua: NEGATIVE
Nitrite: NEGATIVE
Protein, ur: NEGATIVE mg/dL
Specific Gravity, Urine: 1.003 — ABNORMAL LOW (ref 1.005–1.030)
pH: 7 (ref 5.0–8.0)

## 2022-09-05 LAB — CBC
HCT: 43.3 % (ref 36.0–46.0)
Hemoglobin: 14.2 g/dL (ref 12.0–15.0)
MCH: 29.6 pg (ref 26.0–34.0)
MCHC: 32.8 g/dL (ref 30.0–36.0)
MCV: 90.2 fL (ref 80.0–100.0)
Platelets: 220 10*3/uL (ref 150–400)
RBC: 4.8 MIL/uL (ref 3.87–5.11)
RDW: 12.6 % (ref 11.5–15.5)
WBC: 8.7 10*3/uL (ref 4.0–10.5)
nRBC: 0 % (ref 0.0–0.2)

## 2022-09-05 LAB — WET PREP, GENITAL
Clue Cells Wet Prep HPF POC: NONE SEEN
Sperm: NONE SEEN
Trich, Wet Prep: NONE SEEN
WBC, Wet Prep HPF POC: <10 (ref <10)
Yeast Wet Prep HPF POC: NONE SEEN

## 2022-09-05 LAB — LIPASE, BLOOD: Lipase: 37 U/L (ref 11–51)

## 2022-09-05 MED ORDER — NAPROXEN 500 MG PO TABS: 500.0000 mg | ORAL_TABLET | Freq: Two times a day (BID) | ORAL | 0 refills | Status: AC

## 2022-09-05 MED ORDER — ONDANSETRON 8 MG PO TBDP
8.0000 mg | ORAL_TABLET | Freq: Once | ORAL | Status: AC
  Administered 2022-09-05: 8 mg via ORAL
  Filled 2022-09-05: qty 1

## 2022-09-05 MED ORDER — FENTANYL CITRATE PF 50 MCG/ML IJ SOSY
50.0000 ug | PREFILLED_SYRINGE | Freq: Once | INTRAMUSCULAR | Status: AC
  Administered 2022-09-05: 50 ug via INTRAVENOUS
  Filled 2022-09-05: qty 1

## 2022-09-05 MED ORDER — ONDANSETRON HCL 4 MG/2ML IJ SOLN
4.0000 mg | Freq: Once | INTRAMUSCULAR | Status: AC
  Administered 2022-09-05: 4 mg via INTRAVENOUS
  Filled 2022-09-05: qty 2

## 2022-09-05 MED ORDER — FENTANYL CITRATE PF 50 MCG/ML IJ SOSY
25.0000 ug | PREFILLED_SYRINGE | Freq: Once | INTRAMUSCULAR | Status: AC
  Administered 2022-09-05: 25 ug via INTRAVENOUS
  Filled 2022-09-05: qty 1

## 2022-09-05 MED ORDER — ONDANSETRON HCL 4 MG PO TABS: 4.0000 mg | ORAL_TABLET | Freq: Four times a day (QID) | ORAL | 0 refills | Status: AC

## 2022-09-05 MED ORDER — IOHEXOL 300 MG/ML  SOLN
100.0000 mL | Freq: Once | INTRAMUSCULAR | Status: AC | PRN
  Administered 2022-09-05: 100 mL via INTRAVENOUS

## 2022-09-05 NOTE — ED Triage Notes (Signed)
Complaining of abdominal pain on the rt side to the middle of abdomen. Started 3 days ago. Nausea, vomiting and diarrhea.

## 2022-09-05 NOTE — Discharge Instructions (Signed)
As discussed, please call the OB/GYN office listed to arrange a follow-up appointment.  Return to the emergency department for any new or worsening symptoms.

## 2022-09-05 NOTE — ED Provider Notes (Signed)
[place] EMERGENCY DEPARTMENT AT [facility] Provider Note   CSN: [id number] Arrival date & time: 09/05/22  1147     History  Chief Complaint  Patient presents with   Abdominal Pain    Rebekah Gonzalez is a 67 y.o. female.   Abdominal Pain Associated symptoms: chills, diarrhea, fever, nausea and vomiting   Associated symptoms: no chest pain, no constipation, no dysuria, no hematuria, no shortness of breath, no sore throat and no vaginal bleeding        Rebekah Gonzalez is a 67 y.o. female past medical history of hypertension, asthma, hypothyroidism and anxiety who presents to the Emergency Department complaining of persistent, gradually worsening right-sided abdominal pain.  Pain has been present for 2 to 3 weeks, but worse x 3 days.  Pain has been associated with nausea, vomiting, diarrhea fever and chills.  Initially, pain originated at the right flank and radiated to her right lower abdomen.  She had a renal ultrasound ordered by her PCP and she had follow-up appointment with urology without clear source of kidney pathology.  Pain since then has worsened.  States she is unable to tolerate any food or much liquid.  Recommended by PCP to come here for further evaluation.  Has history of cholecystectomy and appendectomy   Home Medications Prior to Admission medications   Medication Sig Start Date End Date Taking? Authorizing Provider  albuterol (PROVENTIL) 2 MG tablet Take 2 mg by mouth 3 (three) times daily. 03/25/22   [provider]  albuterol (VENTOLIN HFA) 108 (90 Base) MCG/ACT inhaler Inhale 1-2 puffs into the lungs every 4 (four) hours as needed for wheezing. 12/18/18   [provider]  ALPRAZolam Prudy Feeler) 1 MG tablet Take 1 mg by mouth 3 (three) times daily. 10/14/14   [provider]  amLODipine (NORVASC) 5 MG tablet Take 1 tablet (5 mg total) by mouth daily. 03/28/22   Vanetta Mulders, MD  atorvastatin (LIPITOR) 10 MG tablet Take 1  tablet by mouth daily. 01/09/19   [provider]  benzonatate (TESSALON) 100 MG capsule Take 1 capsule (100 mg total) by mouth every 8 (eight) hours. 03/25/22   Eber Hong, MD  doxycycline (VIBRA-TABS) 100 MG tablet Take 100 mg by mouth 2 (two) times daily. 03/25/22   [provider]  furosemide (LASIX) 20 MG tablet Take 20 mg by mouth in the morning, at noon, in the evening, and at bedtime.    [provider]  ibuprofen (ADVIL,MOTRIN) 800 MG tablet Take 800 mg by mouth every 8 (eight) hours as needed for mild pain or moderate pain.    [provider]  levothyroxine (SYNTHROID, LEVOTHROID) 75 MCG tablet Take 75 mcg by mouth daily. 10/28/14   [provider]  losartan (COZAAR) 50 MG tablet Take 100 mg by mouth daily. 01/09/19   [provider]  methylPREDNISolone (MEDROL DOSEPAK) 4 MG TBPK tablet Take 1 tablet by mouth as directed. 03/25/22   [provider]  mirtazapine (REMERON) 45 MG tablet Take 45 mg by mouth at bedtime.    [provider]  montelukast (SINGULAIR) 10 MG tablet Take 10 mg by mouth daily. 11/24/20   [provider]  tiZANidine (ZANAFLEX) 4 MG tablet Take 4 mg by mouth 4 (four) times daily as needed for muscle spasms. 11/04/20   [provider]  venlafaxine (EFFEXOR) 37.5 MG tablet Take 37.5 mg by mouth 2 (two) times daily. 01/19/22   [provider]  Allergies    Citalopram hydrobromide, Codeine, Eszopiclone, Paroxetine, and Sertraline hcl    Review of Systems   Review of Systems  Constitutional:  Positive for appetite change, chills, fever and unexpected weight change.  HENT:  Negative for congestion, sore throat and trouble swallowing.   Respiratory:  Negative for shortness of breath.   Cardiovascular:  Negative for chest pain.  Gastrointestinal:  Positive for diarrhea, nausea and vomiting. Negative for abdominal distention, abdominal pain, anal bleeding and constipation.   Genitourinary:  Positive for flank pain. Negative for difficulty urinating, dysuria, hematuria and vaginal bleeding.  Musculoskeletal:  Negative for arthralgias.  Neurological:  Positive for weakness.    Physical Exam Updated Vital Signs BP (!) 142/79 (BP Location: Right Arm)   Pulse 90   Temp 98.6 F (37 C) (Oral)   Resp 18   Ht 5\' 2"  (1.575 m)   Wt 71.7 kg   SpO2 99%   BMI 28.90 kg/m  Physical Exam Vitals and nursing note reviewed. Exam conducted with a chaperone present.  Constitutional:      Appearance: She is well-developed.     Comments: Patient is uncomfortable appearing  HENT:     Mouth/Throat:     Mouth: Mucous membranes are dry.     Comments: Mucous membranes slightly dry Cardiovascular:     Rate and Rhythm: Normal rate and regular rhythm.     Pulses: Normal pulses.  Abdominal:     General: There is no distension.     Palpations: Abdomen is soft. There is no mass.     Tenderness: There is abdominal tenderness. There is no guarding.     Comments: Tender to palpation mid to lower right abdomen.  No guarding or rebound tenderness.  No CVA tenderness  Genitourinary:    Cervix: Cervical motion tenderness present. No cervical bleeding.     Adnexa:        Right: Tenderness present. No mass.         Left: No mass or tenderness.       Comments: Tenderness with palpation of the right adnexa.  There are some cervical motion tenderness as well.  No vaginal bleeding, uterus does not feel enlarged. Musculoskeletal:        General: Normal range of motion.  Skin:    General: Skin is warm.     Capillary Refill: Capillary refill takes less than 2 seconds.  Neurological:     General: No focal deficit present.     Mental Status: She is alert.     ED Results / Procedures / Treatments   Labs (all labs ordered are listed, but only abnormal results are displayed) Labs Reviewed  COMPREHENSIVE METABOLIC PANEL - Abnormal; Notable for the following components:      Result  Value   Potassium 3.3 (*)    Creatinine, Ser 1.06 (*)    AST 44 (*)    GFR, Estimated 58 (*)    All other components within normal limits  URINALYSIS, ROUTINE W REFLEX MICROSCOPIC - Abnormal; Notable for the following components:   Color, Urine STRAW (*)    Specific Gravity, Urine 1.003 (*)    All other components within normal limits  WET PREP, GENITAL  LIPASE, BLOOD  CBC  GC/CHLAMYDIA PROBE AMP ([place]) NOT AT A [facility]    EKG None  Radiology CT ABDOMEN PELVIS W CONTRAST  Result Date: 09/05/2022 CLINICAL DATA:  Right lower quadrant abdominal pain EXAM: CT ABDOMEN AND PELVIS WITH CONTRAST TECHNIQUE: Multidetector CT imaging  of the abdomen and pelvis was performed using the standard protocol following bolus administration of intravenous contrast. RADIATION DOSE REDUCTION: This exam was performed according to the departmental dose-optimization program which includes automated exposure control, adjustment of the mA and/or kV according to patient size and/or use of iterative reconstruction technique. CONTRAST:  OMNIPAQUE IOHEXOL 300 MG/ML  SOLN COMPARISON:  CT examination dated March 17, 2010 FINDINGS: Lower chest: No acute abnormality. Hepatobiliary: No focal liver abnormality is seen. Status post cholecystectomy. No biliary dilatation. Pancreas: Unremarkable. No pancreatic ductal dilatation or surrounding inflammatory changes. Spleen: Normal in size without focal abnormality. Adrenals/Urinary Tract: Adrenal glands are unremarkable. Simple cysts in the upper and lower pole of the right kidney, the upper renal cyst measures up to 2.2 cm and the lower measures up to 2.8 cm. Left lower pole renal cyst measures up to 1.7 cm. These likely represent Bosniak type 1, benign cysts. No evidence of nephrolithiasis or hydronephrosis. Bladder is unremarkable. Stomach/Bowel: Stomach is within normal limits. Surgical sutures at the cecal base suggesting prior appendectomy2. No evidence of bowel wall  thickening, distention, or inflammatory changes. Vascular/Lymphatic: Aortic atherosclerosis. No enlarged abdominal or pelvic lymph nodes. Reproductive: Calcified degenerated fibroid in the posterior uterine wall. No adnexal mass. Other: No abdominal wall hernia or abnormality. No abdominopelvic ascites. Musculoskeletal: Kyphoscoliosis no acute osseous abnormality. Of the thoracolumbar spine with apex right at L1-L2 IMPRESSION: 1. No CT evidence of acute abdominal/pelvic process. 2. Status post cholecystectomy and appendectomy. 3. Bilateral simple renal cysts, which require no further follow-up. 4. Calcified degenerated fibroid in the posterior uterine wall. 5. Kyphoscoliosis of the thoracolumbar spine with apex right at L1-L2. Aortic Atherosclerosis (ICD10-I70.0). Electronically Signed   By: Larose Hires D.O.   On: 09/05/2022 14:46    Procedures Procedures    Medications Ordered in ED Medications  ondansetron (ZOFRAN-ODT) disintegrating tablet 8 mg (has no administration in time range)  fentaNYL (SUBLIMAZE) injection 25 mcg (has no administration in time range)    ED Course/ Medical Decision Making/ A&P                             Medical Decision Making Patient here with persistent abdominal pain for 3 weeks, worse x 3 days.  Pain originated right flank and is radiating to right lower abdomen.  History of prior appendectomy and cholecystectomy years ago.  States her pain has been associated with diarrhea, fever, chills, nausea and vomiting.  Differential would include but not limited to SBO, viral process, sepsis also considered but felt less likely given she is afebrile currently and without tachycardia tachypnea and she has no significant leukocytosis.  Amount and/or Complexity of Data Reviewed Labs: ordered.    Details: Labs interpreted by me, no leukocytosis, hemoglobin unremarkable.  Lipase unremarkable.  Chemistries mild hypokalemia with potassium of 3.3, serum creatinine 1.06 near  baseline.  No significant elevated liver enzymes Radiology: ordered.    Details: CT abdomen and pelvis ordered for further evaluation of patient's symptoms. CT abdomen and pelvis without acute abdominal or pelvic process.  There is a calcified degenerated fibroid in the posterior uterine wall. Discussion of management or test interpretation with external provider(s): No active vomiting here, mucous membranes appear slightly dry.  Patient uncomfortable appearing but nontoxic.  Will order IV fluids, antiemetic and pain medication.  On recheck, patient feeling better.  No vomiting during ER stay.  No evidence of acute abdominal process on CT imaging today.  She does have tenderness with her pelvic exam.  I feel that she would benefit from GYN follow-up she is agreeable to this plan.  Appears appropriate for discharge home.  Risk Prescription drug management.           Final Clinical Impression(s) / ED Diagnoses Final diagnoses:  Right lower quadrant abdominal pain    Rx / DC Orders ED Discharge Orders     None         Rosey Bath 09/06/22 2354    Benjiman Core, MD 09/08/22 1418

## 2022-09-05 NOTE — ED Provider Triage Note (Signed)
Emergency Medicine Provider Triage Evaluation Note  Rebekah Gonzalez , a 67 y.o. female  was evaluated in triage.  Pt complains of abdominal pain right-sided abdominal pain.  Pain has been present for 2 to 3 weeks.  Associated with some intermittent nausea and vomiting.  She has been evaluated by PCP and nephrology for possible kidney related issue.  Per patient, had reassuring ultrasound of her kidney.  Pain has been gradually worsening.  Review of Systems  Positive: Abdominal pain, nausea, vomiting, diarrhea Negative: Dysuria  Physical Exam  BP (!) 142/79 (BP Location: Right Arm)   Pulse 90   Temp 98.6 F (37 C) (Oral)   Resp 18   Ht 5\' 2"  (1.575 m)   Wt 71.7 kg   SpO2 99%   BMI 28.90 kg/m  Gen:   Awake, no distress   Resp:  Normal effort  MSK:   Moves extremities without difficulty  Other:  Tenderness palpation of the right lower abdomen  Medical Decision Making  Medically screening exam initiated at 1:13 PM.  Appropriate orders placed.  Rebekah Gonzalez was informed that the remainder of the evaluation will be completed by another provider, this initial triage assessment does not replace that evaluation, and the importance of remaining in the ED until their evaluation is complete.     Kem Parkinson, PA-C 09/07/22 0004

## 2022-09-06 ENCOUNTER — Telehealth: Payer: Self-pay

## 2022-09-06 LAB — GC/CHLAMYDIA PROBE AMP (~~LOC~~) NOT AT ARMC
Chlamydia: NEGATIVE
Comment: NEGATIVE
Comment: NORMAL
Neisseria Gonorrhea: NEGATIVE

## 2022-09-06 NOTE — Telephone Encounter (Signed)
Patient was seen in the ER and was referred here and was calling about getting an appointment scheduled.

## 2022-09-21 ENCOUNTER — Ambulatory Visit: Payer: Medicare HMO | Admitting: Obstetrics & Gynecology

## 2022-09-21 ENCOUNTER — Other Ambulatory Visit (HOSPITAL_COMMUNITY)
Admission: RE | Admit: 2022-09-21 | Discharge: 2022-09-21 | Disposition: A | Discharge status: Home / Self Care | Payer: Medicare HMO | Source: Ambulatory Visit | Attending: Obstetrics & Gynecology | Admitting: Obstetrics & Gynecology

## 2022-09-21 ENCOUNTER — Encounter: Payer: Self-pay | Admitting: Obstetrics & Gynecology

## 2022-09-21 VITALS — BP 123/72 | HR 82 | Ht 63.0 in | Wt 162.4 lb

## 2022-09-21 DIAGNOSIS — R102 Pelvic and perineal pain: Secondary | ICD-10-CM

## 2022-09-21 DIAGNOSIS — N950 Postmenopausal bleeding: Secondary | ICD-10-CM | POA: Diagnosis not present

## 2022-09-21 DIAGNOSIS — Z124 Encounter for screening for malignant neoplasm of cervix: Secondary | ICD-10-CM | POA: Insufficient documentation

## 2022-09-21 NOTE — Progress Notes (Signed)
GYN VISIT Patient name: Rebekah Gonzalez MRN [id number]  Date of birth: [birth year] Chief Complaint:   Fibroids (Having a lot of pain)  History of Present Illness:   Rebekah Gonzalez is a 67 y.o. PM for the following concerns:  -PMB, Pelvic pain: Initially noted this past summer x 2, but then resolved.  Then, started again about 2 weeks.  BRB seeing a few spots on her pad.  Bleeding after IC- pink to red occurs every time, just a little as well as considerable pain.   Her main concern is the pain.  Pain as sharp 10/10 constant pain.  States pain is impacting her day to day activity.  Notes constant pain- for the past 2 mos.  Cannot take NSAIDs, no improvement with tylenol and/or ultram.    No GI symptoms.  Notes incomplete void- s/p urination will still have pressure like she needs to void.  Denies dysuria or hematuria.  Seen in ER due to pain: Abd CT- no acute abnormalities noted.  Posterior degenerated fibroid noted- no other abnormalities.  No LMP recorded. Patient is postmenopausal.     09/21/2022   11:53 AM  Depression screen PHQ 2/9  Decreased Interest 2  Down, Depressed, Hopeless 1  PHQ - 2 Score 3  Altered sleeping 3  Tired, decreased energy 3  Change in appetite 2  Feeling bad or failure about yourself  0  Trouble concentrating 0  Moving slowly or fidgety/restless 0  Suicidal thoughts 0  PHQ-9 Score 11     Review of Systems:   Pertinent items are noted in HPI Denies fever/chills, dizziness, headaches, visual disturbances, fatigue, shortness of breath, chest pain, vomiting. Pertinent History Reviewed:  Reviewed past medical,surgical, social, obstetrical and family history.  Reviewed problem list, medications and allergies. Physical Assessment:   Vitals:   09/21/22 1150  BP: 123/72  Pulse: 82  Weight: 162 lb 6.4 oz (73.7 kg)  Height: 5\' 3"  (1.6 m)  Body mass index is 28.77 kg/m.       Physical Examination:   General appearance: alert, well appearing, and in  no distress  Psych: mood appropriate, normal affect  Skin: warm & dry   Cardiovascular: normal heart rate noted  Respiratory: normal respiratory effort, no distress  Abdomen: soft, mid to right lower quadrant tenderness  Pelvic: VULVA: normal appearing vulva with no masses, tenderness or lesions, VAGINA: normal appearing vagina with normal color and discharge, no lesions, atrophic changes noted with flattening of mucosa CERVIX: normal appearing cervix without discharge or lesions, pap collected  UTERUS: uterus is normal size, shape, consistency - +tenderness on exam ADNEXA: normal adnexa in size, nontender and no masses  Extremities: no edema   Chaperone: Celene Squibb    Endometrial Biopsy Procedure Note  Pre-operative Diagnosis: postmenopausal bleeding  Post-operative Diagnosis: same  Procedure Details  The risks (including infection, bleeding, pain, and uterine perforation) and benefits of the procedure were explained to the patient and Written informed consent was obtained.  Antibiotic prophylaxis against endocarditis was not indicated.   The patient was placed in the dorsal lithotomy position.  Bimanual exam showed the uterus to be in the neutral position.  A speculum inserted in the vagina, and the cervix prepped with betadine.     A single tooth tenaculum was applied to the anterior lip of the cervix for stabilization.  A Pipelle endometrial aspirator was used to sample the endometrium with return of minimal tissue.  Sample was sent for pathologic examination.  Condition:  Stable  Complications: None   Assessment & Plan:  1) Pelvic pain -etiology of pain unclear though seems uterine in nature -reviewed that degenerated fibroid unlikely to be source of pain though can occur, but would have expected this closer to menopause -plan for pelvic US for further evaluation -unfortunately due to kidney disease advised against regular use of NSAID. Advised for one time dose  following today's exam  2) PMB -reviewed potential etiologies -as mentioned pelvic US ordered -EMB also discussed and collected today- see above -Next step pending results of pathology.   The patient was advised to call for any fever or for prolonged or severe pain or bleeding. She was advised to use OTC analgesics as needed for mild to moderate pain. She was advised to avoid vaginal intercourse for 48 hours or until the bleeding has completely stopped.  3) Preventive screening -no prior pap in chart, collected today -mammogram outside facility    Orders Placed This Encounter  Procedures   US PELVIC COMPLETE WITH TRANSVAGINAL    Return for Pelvic US- next available either [facility] or our office.   Janyth Pupa, DO Attending [place], [facility] for [employer], [place]

## 2022-09-23 LAB — CYTOLOGY - PAP
Comment: NEGATIVE
Diagnosis: NEGATIVE
High risk HPV: NEGATIVE

## 2022-09-23 LAB — SURGICAL PATHOLOGY

## 2022-10-03 ENCOUNTER — Ambulatory Visit (HOSPITAL_COMMUNITY)
Admission: RE | Admit: 2022-10-03 | Discharge: 2022-10-03 | Disposition: A | Discharge status: Home / Self Care | Payer: Medicare HMO | Source: Ambulatory Visit | Attending: Obstetrics & Gynecology | Admitting: Obstetrics & Gynecology

## 2022-10-03 DIAGNOSIS — N950 Postmenopausal bleeding: Secondary | ICD-10-CM | POA: Diagnosis not present

## 2022-10-31 DIAGNOSIS — E039 Hypothyroidism, unspecified: Secondary | ICD-10-CM | POA: Diagnosis not present

## 2022-10-31 DIAGNOSIS — K08409 Partial loss of teeth, unspecified cause, unspecified class: Secondary | ICD-10-CM | POA: Diagnosis not present

## 2022-10-31 DIAGNOSIS — I10 Essential (primary) hypertension: Secondary | ICD-10-CM | POA: Diagnosis not present

## 2022-10-31 DIAGNOSIS — H3530 Unspecified macular degeneration: Secondary | ICD-10-CM | POA: Diagnosis not present

## 2022-10-31 DIAGNOSIS — Z008 Encounter for other general examination: Secondary | ICD-10-CM | POA: Diagnosis not present

## 2022-10-31 DIAGNOSIS — Z809 Family history of malignant neoplasm, unspecified: Secondary | ICD-10-CM | POA: Diagnosis not present

## 2022-10-31 DIAGNOSIS — F419 Anxiety disorder, unspecified: Secondary | ICD-10-CM | POA: Diagnosis not present

## 2022-10-31 DIAGNOSIS — F325 Major depressive disorder, single episode, in full remission: Secondary | ICD-10-CM | POA: Diagnosis not present

## 2022-10-31 DIAGNOSIS — E785 Hyperlipidemia, unspecified: Secondary | ICD-10-CM | POA: Diagnosis not present

## 2022-10-31 DIAGNOSIS — H269 Unspecified cataract: Secondary | ICD-10-CM | POA: Diagnosis not present

## 2022-10-31 DIAGNOSIS — E669 Obesity, unspecified: Secondary | ICD-10-CM | POA: Diagnosis not present

## 2022-10-31 DIAGNOSIS — Z885 Allergy status to narcotic agent status: Secondary | ICD-10-CM | POA: Diagnosis not present

## 2022-10-31 DIAGNOSIS — R609 Edema, unspecified: Secondary | ICD-10-CM | POA: Diagnosis not present

## 2022-12-14 DIAGNOSIS — M4306 Spondylolysis, lumbar region: Secondary | ICD-10-CM | POA: Diagnosis not present

## 2022-12-14 DIAGNOSIS — I10 Essential (primary) hypertension: Secondary | ICD-10-CM | POA: Diagnosis not present

## 2022-12-14 DIAGNOSIS — E6609 Other obesity due to excess calories: Secondary | ICD-10-CM | POA: Diagnosis not present

## 2022-12-14 DIAGNOSIS — J45909 Unspecified asthma, uncomplicated: Secondary | ICD-10-CM | POA: Diagnosis not present

## 2022-12-14 DIAGNOSIS — R140 Abdominal distension (gaseous): Secondary | ICD-10-CM | POA: Diagnosis not present

## 2022-12-14 DIAGNOSIS — E063 Autoimmune thyroiditis: Secondary | ICD-10-CM | POA: Diagnosis not present

## 2022-12-14 DIAGNOSIS — Z6828 Body mass index (BMI) 28.0-28.9, adult: Secondary | ICD-10-CM | POA: Diagnosis not present

## 2022-12-14 DIAGNOSIS — E039 Hypothyroidism, unspecified: Secondary | ICD-10-CM | POA: Diagnosis not present

## 2022-12-14 DIAGNOSIS — N189 Chronic kidney disease, unspecified: Secondary | ICD-10-CM | POA: Diagnosis not present

## 2022-12-21 DIAGNOSIS — R1013 Epigastric pain: Secondary | ICD-10-CM | POA: Diagnosis not present

## 2023-02-09 DIAGNOSIS — M5126 Other intervertebral disc displacement, lumbar region: Secondary | ICD-10-CM | POA: Diagnosis not present

## 2023-02-09 DIAGNOSIS — E663 Overweight: Secondary | ICD-10-CM | POA: Diagnosis not present

## 2023-02-09 DIAGNOSIS — J45909 Unspecified asthma, uncomplicated: Secondary | ICD-10-CM | POA: Diagnosis not present

## 2023-02-09 DIAGNOSIS — N189 Chronic kidney disease, unspecified: Secondary | ICD-10-CM | POA: Diagnosis not present

## 2023-02-09 DIAGNOSIS — I10 Essential (primary) hypertension: Secondary | ICD-10-CM | POA: Diagnosis not present

## 2023-02-09 DIAGNOSIS — Z6827 Body mass index (BMI) 27.0-27.9, adult: Secondary | ICD-10-CM | POA: Diagnosis not present

## 2023-03-20 DIAGNOSIS — N1831 Chronic kidney disease, stage 3a: Secondary | ICD-10-CM | POA: Diagnosis not present

## 2023-03-20 DIAGNOSIS — I129 Hypertensive chronic kidney disease with stage 1 through stage 4 chronic kidney disease, or unspecified chronic kidney disease: Secondary | ICD-10-CM | POA: Diagnosis not present

## 2023-03-20 DIAGNOSIS — K219 Gastro-esophageal reflux disease without esophagitis: Secondary | ICD-10-CM | POA: Diagnosis not present

## 2023-03-20 DIAGNOSIS — N179 Acute kidney failure, unspecified: Secondary | ICD-10-CM | POA: Diagnosis not present

## 2023-03-20 DIAGNOSIS — M545 Low back pain, unspecified: Secondary | ICD-10-CM | POA: Diagnosis not present

## 2023-04-06 DIAGNOSIS — S6992XA Unspecified injury of left wrist, hand and finger(s), initial encounter: Secondary | ICD-10-CM | POA: Diagnosis not present

## 2023-04-12 ENCOUNTER — Other Ambulatory Visit (HOSPITAL_COMMUNITY): Payer: Self-pay | Admitting: Internal Medicine

## 2023-04-12 DIAGNOSIS — Z1231 Encounter for screening mammogram for malignant neoplasm of breast: Secondary | ICD-10-CM

## 2023-05-01 DIAGNOSIS — Z6827 Body mass index (BMI) 27.0-27.9, adult: Secondary | ICD-10-CM | POA: Diagnosis not present

## 2023-05-01 DIAGNOSIS — Z23 Encounter for immunization: Secondary | ICD-10-CM | POA: Diagnosis not present

## 2023-05-01 DIAGNOSIS — E663 Overweight: Secondary | ICD-10-CM | POA: Diagnosis not present

## 2023-05-01 DIAGNOSIS — N189 Chronic kidney disease, unspecified: Secondary | ICD-10-CM | POA: Diagnosis not present

## 2023-05-01 DIAGNOSIS — I10 Essential (primary) hypertension: Secondary | ICD-10-CM | POA: Diagnosis not present

## 2023-05-01 DIAGNOSIS — J45909 Unspecified asthma, uncomplicated: Secondary | ICD-10-CM | POA: Diagnosis not present

## 2023-06-01 DIAGNOSIS — Z1231 Encounter for screening mammogram for malignant neoplasm of breast: Secondary | ICD-10-CM | POA: Diagnosis not present

## 2023-08-03 DIAGNOSIS — F419 Anxiety disorder, unspecified: Secondary | ICD-10-CM | POA: Diagnosis not present

## 2023-08-03 DIAGNOSIS — I129 Hypertensive chronic kidney disease with stage 1 through stage 4 chronic kidney disease, or unspecified chronic kidney disease: Secondary | ICD-10-CM | POA: Diagnosis not present

## 2023-08-03 DIAGNOSIS — J45909 Unspecified asthma, uncomplicated: Secondary | ICD-10-CM | POA: Diagnosis not present

## 2023-08-03 DIAGNOSIS — Z8249 Family history of ischemic heart disease and other diseases of the circulatory system: Secondary | ICD-10-CM | POA: Diagnosis not present

## 2023-08-03 DIAGNOSIS — E785 Hyperlipidemia, unspecified: Secondary | ICD-10-CM | POA: Diagnosis not present

## 2023-08-03 DIAGNOSIS — Z008 Encounter for other general examination: Secondary | ICD-10-CM | POA: Diagnosis not present

## 2023-08-03 DIAGNOSIS — I2510 Atherosclerotic heart disease of native coronary artery without angina pectoris: Secondary | ICD-10-CM | POA: Diagnosis not present

## 2023-08-03 DIAGNOSIS — M62838 Other muscle spasm: Secondary | ICD-10-CM | POA: Diagnosis not present

## 2023-08-03 DIAGNOSIS — E039 Hypothyroidism, unspecified: Secondary | ICD-10-CM | POA: Diagnosis not present

## 2023-08-03 DIAGNOSIS — Z809 Family history of malignant neoplasm, unspecified: Secondary | ICD-10-CM | POA: Diagnosis not present

## 2023-08-03 DIAGNOSIS — I700 Atherosclerosis of aorta: Secondary | ICD-10-CM | POA: Diagnosis not present

## 2023-08-03 DIAGNOSIS — Z823 Family history of stroke: Secondary | ICD-10-CM | POA: Diagnosis not present

## 2023-08-03 DIAGNOSIS — Z9181 History of falling: Secondary | ICD-10-CM | POA: Diagnosis not present

## 2023-08-24 DIAGNOSIS — J45909 Unspecified asthma, uncomplicated: Secondary | ICD-10-CM | POA: Diagnosis not present

## 2023-08-24 DIAGNOSIS — I10 Essential (primary) hypertension: Secondary | ICD-10-CM | POA: Diagnosis not present

## 2023-08-24 DIAGNOSIS — Z6827 Body mass index (BMI) 27.0-27.9, adult: Secondary | ICD-10-CM | POA: Diagnosis not present

## 2023-08-24 DIAGNOSIS — F419 Anxiety disorder, unspecified: Secondary | ICD-10-CM | POA: Diagnosis not present

## 2023-08-24 DIAGNOSIS — J0100 Acute maxillary sinusitis, unspecified: Secondary | ICD-10-CM | POA: Diagnosis not present

## 2023-09-27 DIAGNOSIS — N179 Acute kidney failure, unspecified: Secondary | ICD-10-CM | POA: Diagnosis not present

## 2023-09-27 DIAGNOSIS — I129 Hypertensive chronic kidney disease with stage 1 through stage 4 chronic kidney disease, or unspecified chronic kidney disease: Secondary | ICD-10-CM | POA: Diagnosis not present

## 2023-09-27 DIAGNOSIS — K219 Gastro-esophageal reflux disease without esophagitis: Secondary | ICD-10-CM | POA: Diagnosis not present

## 2023-09-27 DIAGNOSIS — N1831 Chronic kidney disease, stage 3a: Secondary | ICD-10-CM | POA: Diagnosis not present

## 2023-09-27 DIAGNOSIS — M545 Low back pain, unspecified: Secondary | ICD-10-CM | POA: Diagnosis not present

## 2023-12-13 DIAGNOSIS — J45909 Unspecified asthma, uncomplicated: Secondary | ICD-10-CM | POA: Diagnosis not present

## 2023-12-13 DIAGNOSIS — J309 Allergic rhinitis, unspecified: Secondary | ICD-10-CM | POA: Diagnosis not present

## 2023-12-13 DIAGNOSIS — E663 Overweight: Secondary | ICD-10-CM | POA: Diagnosis not present

## 2023-12-13 DIAGNOSIS — I10 Essential (primary) hypertension: Secondary | ICD-10-CM | POA: Diagnosis not present

## 2023-12-13 DIAGNOSIS — R42 Dizziness and giddiness: Secondary | ICD-10-CM | POA: Diagnosis not present

## 2023-12-13 DIAGNOSIS — H81399 Other peripheral vertigo, unspecified ear: Secondary | ICD-10-CM | POA: Diagnosis not present

## 2023-12-13 DIAGNOSIS — F419 Anxiety disorder, unspecified: Secondary | ICD-10-CM | POA: Diagnosis not present

## 2023-12-13 DIAGNOSIS — N189 Chronic kidney disease, unspecified: Secondary | ICD-10-CM | POA: Diagnosis not present

## 2023-12-13 DIAGNOSIS — Z6827 Body mass index (BMI) 27.0-27.9, adult: Secondary | ICD-10-CM | POA: Diagnosis not present

## 2023-12-13 DIAGNOSIS — J329 Chronic sinusitis, unspecified: Secondary | ICD-10-CM | POA: Diagnosis not present

## 2024-05-27 DIAGNOSIS — E039 Hypothyroidism, unspecified: Secondary | ICD-10-CM | POA: Diagnosis not present

## 2024-05-27 DIAGNOSIS — I129 Hypertensive chronic kidney disease with stage 1 through stage 4 chronic kidney disease, or unspecified chronic kidney disease: Secondary | ICD-10-CM | POA: Diagnosis not present

## 2024-05-27 DIAGNOSIS — N179 Acute kidney failure, unspecified: Secondary | ICD-10-CM | POA: Diagnosis not present

## 2024-05-27 DIAGNOSIS — K219 Gastro-esophageal reflux disease without esophagitis: Secondary | ICD-10-CM | POA: Diagnosis not present

## 2024-05-27 DIAGNOSIS — M545 Low back pain, unspecified: Secondary | ICD-10-CM | POA: Diagnosis not present

## 2024-05-27 DIAGNOSIS — N1831 Chronic kidney disease, stage 3a: Secondary | ICD-10-CM | POA: Diagnosis not present
